# Patient Record
Sex: Female | Born: 1975 | Race: White | Hispanic: No | Marital: Married | Smoking: Former smoker
Health system: Southern US, Community
[De-identification: ages and names within clinical notes are randomized; demographics above are authoritative.]

## PROBLEM LIST (undated history)

## (undated) DIAGNOSIS — B009 Herpesviral infection, unspecified: Secondary | ICD-10-CM

## (undated) DIAGNOSIS — Z464 Encounter for fitting and adjustment of orthodontic device: Secondary | ICD-10-CM

## (undated) DIAGNOSIS — J358 Other chronic diseases of tonsils and adenoids: Secondary | ICD-10-CM

## (undated) DIAGNOSIS — J3089 Other allergic rhinitis: Secondary | ICD-10-CM

## (undated) DIAGNOSIS — IMO0001 Reserved for inherently not codable concepts without codable children: Secondary | ICD-10-CM

## (undated) HISTORY — PX: APPENDECTOMY: SHX54

## (undated) HISTORY — PX: CHOLECYSTECTOMY: SHX55

## (undated) HISTORY — PX: SALPINGECTOMY: SHX328

---

## 1998-07-26 ENCOUNTER — Other Ambulatory Visit: Admission: RE | Admit: 1998-07-26 | Discharge: 1998-07-26 | Payer: Self-pay | Admitting: Obstetrics and Gynecology

## 2007-04-24 ENCOUNTER — Ambulatory Visit: Payer: Self-pay | Admitting: Internal Medicine

## 2007-04-30 ENCOUNTER — Ambulatory Visit: Payer: Self-pay | Admitting: Internal Medicine

## 2007-08-13 ENCOUNTER — Ambulatory Visit: Payer: Self-pay | Admitting: Internal Medicine

## 2010-06-11 ENCOUNTER — Ambulatory Visit: Payer: Self-pay | Admitting: Internal Medicine

## 2011-05-27 ENCOUNTER — Ambulatory Visit: Payer: Self-pay | Admitting: Internal Medicine

## 2012-12-02 ENCOUNTER — Ambulatory Visit: Payer: Self-pay | Admitting: Family Medicine

## 2012-12-02 LAB — RAPID STREP-A WITH REFLX: Micro Text Report: NEGATIVE

## 2012-12-06 LAB — BETA STREP CULTURE(ARMC)

## 2013-07-02 ENCOUNTER — Ambulatory Visit: Payer: Self-pay | Admitting: Neurology

## 2013-07-20 ENCOUNTER — Ambulatory Visit: Payer: Self-pay | Admitting: Emergency Medicine

## 2013-07-20 LAB — RAPID INFLUENZA A&B ANTIGENS

## 2014-01-28 ENCOUNTER — Ambulatory Visit: Payer: Self-pay | Admitting: Obstetrics and Gynecology

## 2014-01-28 LAB — BASIC METABOLIC PANEL
ANION GAP: 6 — AB (ref 7–16)
BUN: 14 mg/dL (ref 7–18)
Calcium, Total: 8.8 mg/dL (ref 8.5–10.1)
Chloride: 104 mmol/L (ref 98–107)
Co2: 27 mmol/L (ref 21–32)
Creatinine: 0.76 mg/dL (ref 0.60–1.30)
EGFR (African American): >60
EGFR (Non-African Amer.): >60
Glucose: 87 mg/dL (ref 65–99)
OSMOLALITY: 274 (ref 275–301)
Potassium: 4 mmol/L (ref 3.5–5.1)
Sodium: 137 mmol/L (ref 136–145)

## 2014-01-28 LAB — CBC
HCT: 38.7 % (ref 35.0–47.0)
HGB: 12.9 g/dL (ref 12.0–16.0)
MCH: 32.8 pg (ref 26.0–34.0)
MCHC: 33.2 g/dL (ref 32.0–36.0)
MCV: 99 fL (ref 80–100)
Platelet: 279 10*3/uL (ref 150–440)
RBC: 3.92 10*6/uL (ref 3.80–5.20)
RDW: 12.6 % (ref 11.5–14.5)
WBC: 10.3 10*3/uL (ref 3.6–11.0)

## 2014-02-06 ENCOUNTER — Ambulatory Visit: Payer: Self-pay | Admitting: Obstetrics and Gynecology

## 2014-02-10 LAB — PATHOLOGY REPORT

## 2014-10-03 ENCOUNTER — Ambulatory Visit
Admit: 2014-10-03 | Disposition: A | Discharge status: Home / Self Care | Payer: Self-pay | Attending: Family Medicine | Admitting: Family Medicine

## 2014-10-03 LAB — RAPID STREP-A WITH REFLX: MICRO TEXT REPORT: NEGATIVE

## 2014-10-06 LAB — BETA STREP CULTURE(ARMC)

## 2014-10-17 NOTE — Op Note (Signed)
PATIENT NAME:  Krystal Lawrence, [name] MR#:  [id number] DATE OF BIRTH:  12/01/1975  DATE OF PROCEDURE:  02/06/2014  PREOPERATIVE DIAGNOSIS: Multiparous female desiring permanent sterilization and prophylax for ovarian cancer.   POSTOPERATIVE DIAGNOSIS: Multiparous female desiring permanent sterilization and prophylax for ovarian cancer.   PROCEDURE: Bilateral laparoscopic salpingectomy.   ESTIMATED BLOOD LOSS: 25 mL.   FINDINGS: Fibroid uterus with normal tubes bilaterally. Normal ovaries bilaterally. Tubes removed post surgery.   DESCRIPTION OF PROCEDURE: The patient was taken to the operating room and placed in supine position. After adequate general endotracheal anesthesia was instilled, the patient was prepped and draped in the usual sterile fashion. A side-opening speculum was placed in the patient's vagina and the anterior lip of the cervix was grasped with a single-tooth tenaculum. Hulka tenaculum was placed and the side-opening speculum was removed as well as the single-tooth tenaculum. Attention was then turned to the umbilicus which was injected with Marcaine. Timeout was performed. Incision was made at the umbilicus. Veress needle was placed. Hanging drop test, fluid instillation test, and fluid aspiration test showed proper placement of Veress needle. CO2 was placed on low flow. When tympany was heard around the liver, CO2 was placed on high flow. Veress needle was removed. Trocar was placed. The patient was placed in Trendelenburg. The aforementioned findings were seen. The patient's previous incisions were used to make the two lower ports, one in the right lower quadrant, one in the midline. Harmonic scalpel and grasper was then used to cut across the broad ligament to the tube. The tube was then detached and the attachment to the uterus was cauterized with the Kleppinger. Tube was removed from the abdomen. In a similar fashion, the right tube was grasped, broad ligament was cut. Tube was excised  and removed from the belly. Belly was doused in Marcaine and trocars were then removed; 4-0 Monocryl was used to approximate the skin edges. Dermabond was placed, 2 x 2's and Tegaderm. Hulka tenaculum was removed from the patient's uterus. The patient was laid supine and taken to recovery after having tolerated the procedure well.     ____________________________ Elliot Gurneyarrie C. Oran Dillenburg, MD cck:JT D: 02/10/2014 23:55:21 ET T: 02/11/2014 00:42:43 ET JOB#: [id number]  cc: Elliot Gurneyarrie C. Brayden Betters, MD, <Dictator> Elliot GurneyARRIE C Berlin Viereck MD ELECTRONICALLY SIGNED 02/14/2014 17:40

## 2015-09-16 ENCOUNTER — Ambulatory Visit
Admission: EM | Admit: 2015-09-16 | Discharge: 2015-09-16 | Disposition: A | Discharge status: Home / Self Care | Attending: Emergency Medicine | Admitting: Emergency Medicine

## 2015-09-16 ENCOUNTER — Encounter: Payer: Self-pay | Admitting: Emergency Medicine

## 2015-09-16 DIAGNOSIS — J0140 Acute pansinusitis, unspecified: Secondary | ICD-10-CM

## 2015-09-16 DIAGNOSIS — R059 Cough, unspecified: Secondary | ICD-10-CM

## 2015-09-16 DIAGNOSIS — R05 Cough: Secondary | ICD-10-CM

## 2015-09-16 MED ORDER — DOXYCYCLINE HYCLATE 100 MG PO CAPS: 100.0000 mg | ORAL_CAPSULE | Freq: Two times a day (BID) | ORAL | Status: DC

## 2015-09-16 MED ORDER — HYDROCOD POLST-CPM POLST ER 10-8 MG/5ML PO SUER: 5.0000 mL | Freq: Two times a day (BID) | ORAL | Status: DC | PRN

## 2015-09-16 MED ORDER — AEROCHAMBER PLUS MISC: Status: DC

## 2015-09-16 MED ORDER — BENZONATATE 100 MG PO CAPS: 200.0000 mg | ORAL_CAPSULE | Freq: Three times a day (TID) | ORAL | Status: DC

## 2015-09-16 MED ORDER — ALBUTEROL SULFATE HFA 108 (90 BASE) MCG/ACT IN AERS: 1.0000 | INHALATION_SPRAY | Freq: Four times a day (QID) | RESPIRATORY_TRACT | Status: DC | PRN

## 2015-09-16 MED ORDER — FLUCONAZOLE 150 MG PO TABS: 150.0000 mg | ORAL_TABLET | Freq: Once | ORAL | Status: DC

## 2015-09-16 NOTE — ED Notes (Signed)
Patient  c/o cough and chest congestion for over a week.   

## 2015-09-16 NOTE — Discharge Instructions (Signed)
2 puffs from your albuterol inhaler every 4-6 hours as needed for coughing, chest tightness, shortness of breath. Continue the ibuprofen. Start some Mucinex D, saline nasal irrigation with either a neti pot or Neil sinus rinse.

## 2015-09-16 NOTE — ED Provider Notes (Signed)
HPI  SUBJECTIVE:  Krystal Lawrence is a 40 y.o. female who presents with 11 days of fever Tmax 101.1, chills, nausea, nasal congestion, rhinorrhea, bilateral ear pain. She reports sore throat, body aches, headaches, postnasal drip. She reports cough productive of yellowish greenish sputum, constant chest tightness with no exertional positional component, wheezing, shortness of breath with coughing only. States that she is unable to sleep at night secondary to coughing. Denies other chest pain. No abdominal pain, urinary complaints. There are no aggravating or alleviating factors. She has been taking Delsym, Tessalon 200 mg, Sudafed, ibuprofen, and is on day #3 of amoxicillin 875 mg. She was seen by her primary care physician earlier in the course of the illness, sent home with Tessalon Perles and amoxicillin. She states that she was getting better, and then got worse. She has taken ibuprofen in the past 4-6 hours prior to arrival. Past medical history negative for asthma, eczema, COPD, diabetes, hypertension. She is not a smoker. LMP: 3/3. PMD: [facility].    History reviewed. No pertinent past medical history.  Past Surgical History  Procedure Laterality Date  . Appendectomy    . Cholecystectomy      History reviewed. No pertinent family history.  Social History  Substance Use Topics  . Smoking status: Never Smoker   . Smokeless tobacco: None  . Alcohol Use: Yes    No current facility-administered medications for this encounter.  Current outpatient prescriptions:  .  albuterol (PROVENTIL HFA;VENTOLIN HFA) 108 (90 Base) MCG/ACT inhaler, Inhale 1-2 puffs into the lungs every 6 (six) hours as needed for wheezing or shortness of breath., Disp: 1 Inhaler, Rfl: 0 .  benzonatate (TESSALON) 100 MG capsule, Take 2 capsules (200 mg total) by mouth every 8 (eight) hours., Disp: 21 capsule, Rfl: 0 .  chlorpheniramine-HYDROcodone (TUSSIONEX PENNKINETIC ER) 10-8 MG/5ML SUER, Take 5 mLs by  mouth every 12 (twelve) hours as needed for cough., Disp: 120 mL, Rfl: 0 .  doxycycline (VIBRAMYCIN) 100 MG capsule, Take 1 capsule (100 mg total) by mouth 2 (two) times daily. X 7 days, Disp: 14 capsule, Rfl: 0 .  fluconazole (DIFLUCAN) 150 MG tablet, Take 1 tablet (150 mg total) by mouth once. 1 tab po x 1. May repeat in 72 hours if no improvement, Disp: 2 tablet, Rfl: 1 .  Spacer/Aero-Holding Chambers (AEROCHAMBER PLUS) inhaler, Use as instructed, Disp: 1 each, Rfl: 2  No Known Allergies   ROS  As noted in HPI.   Physical Exam  BP 148/83 mmHg  Pulse 100  Temp(Src) 98.7 F (37.1 C) (Tympanic)  Resp 17  Ht 5\' 5"  (1.651 m)  Wt 157 lb (71.215 kg)  BMI 26.13 kg/m2  SpO2 100%  LMP 08/27/2015 (Exact Date)  Constitutional: Well developed, well nourished, no acute distress Eyes: PERRL, EOMI, conjunctiva normal bilaterally HENT: Normocephalic, atraumatic,mucus membranes moist. TMs normal bilaterally. Positive maxillary, frontal sinus tenderness. Erythematous, swollen turbinates. Positive nasal congestion. Throat slightly erythematous, tonsils normal, uvula midline. Neck: No cervical lymphadenopathy, meningismus. Respiratory: Clear to auscultation bilaterally, no rales, no wheezing, no rhonchi Cardiovascular: Regular tachycardia, no murmurs, no gallops, no rubs GI: Soft, nondistended, nontender, no rebound, no guarding Back: no CVAT skin: No rash, skin intact Musculoskeletal: No edema, no tenderness, no deformities Neurologic: Alert & oriented x 3, CN II-XII grossly intact, no motor deficits, sensation grossly intact Psychiatric: Speech and behavior appropriate   ED Course   Medications - No data to display  No orders of the defined types were placed  in this encounter.   No results found for this or any previous visit (from the past 24 hour(s)). No results found.  ED Clinical Impression  Acute pansinusitis, recurrence not specified  Cough  ED Assessment/Plan  Patient  not getting better despite the amoxicillin. She does have some sinus tenderness, so we'll switch her to doxycycline which cover both sinusitis and pneumonia. Lungs clear today. Plan to start Mucinex D, refill her Tessalon pearls 200 mg, cough syrup, saline nasal irrigation, nasal steroid, albuterol with spacer,  Diflucan as patient states that she gets yeast infections frequently with antibiotics. Up with primary care physician as needed. Go to the ER if she gets worse.  Discussed MDM, plan and followup with patient. Discussed sn/sx that should prompt return to the  ED. Patient agrees with plan.   *This clinic note was created using Dragon dictation software. Therefore, there may be occasional mistakes despite careful proofreading.  ?   Domenick Gong, MD 09/16/15 1215

## 2016-06-16 ENCOUNTER — Ambulatory Visit
Admission: EM | Admit: 2016-06-16 | Discharge: 2016-06-16 | Disposition: A | Discharge status: Home / Self Care | Attending: Family Medicine | Admitting: Family Medicine

## 2016-06-16 ENCOUNTER — Encounter: Payer: Self-pay | Admitting: Emergency Medicine

## 2016-06-16 DIAGNOSIS — J40 Bronchitis, not specified as acute or chronic: Secondary | ICD-10-CM

## 2016-06-16 MED ORDER — FLUCONAZOLE 150 MG PO TABS: 150.0000 mg | ORAL_TABLET | Freq: Once | ORAL | 0 refills | Status: AC

## 2016-06-16 MED ORDER — ALBUTEROL SULFATE HFA 108 (90 BASE) MCG/ACT IN AERS: 2.0000 | INHALATION_SPRAY | Freq: Four times a day (QID) | RESPIRATORY_TRACT | 0 refills | Status: DC | PRN

## 2016-06-16 MED ORDER — AZITHROMYCIN 250 MG PO TABS: ORAL_TABLET | ORAL | 0 refills | Status: DC

## 2016-06-16 NOTE — ED Triage Notes (Signed)
Patient c/o cough and chest congestion for 6 weeks.  Patient denies recent fevers.

## 2016-06-16 NOTE — ED Provider Notes (Signed)
CSN: [id number]     Arrival date & time 06/16/16  1930 History   First MD Initiated Contact with Patient 06/16/16 1956     Chief Complaint  Patient presents with  . Cough   (Consider location/radiation/quality/duration/timing/severity/associated sxs/prior Treatment) 40 year old female presents with nasal congestion, cough, chest tightness and ear fullness for the past 6 weeks. Denies any fever or GI symptoms. Cough is worse at night and coughing up yellow mucus in the morning. Has become worse in the past few days. Has tried Mucinex DM and Sudafed with minimal relief. No recent travel. Daughter is ill and being seen now with probable GI virus.    The history is provided by the patient.    History reviewed. No pertinent past medical history. Past Surgical History:  Procedure Laterality Date  . APPENDECTOMY    . CHOLECYSTECTOMY     History reviewed. No pertinent family history. Social History  Substance Use Topics  . Smoking status: Never Smoker  . Smokeless tobacco: Never Used  . Alcohol use Yes   OB History    No data available     Review of Systems  Constitutional: Positive for fatigue. Negative for appetite change, chills and fever.  HENT: Positive for congestion. Negative for ear pain (ear fullness), rhinorrhea, sinus pain, sinus pressure and sore throat.   Eyes: Negative for discharge.  Respiratory: Positive for cough and chest tightness. Negative for shortness of breath and wheezing.   Cardiovascular: Negative for chest pain.  Gastrointestinal: Negative for abdominal pain, diarrhea, nausea and vomiting.  Musculoskeletal: Negative for back pain, neck pain and neck stiffness.  Skin: Negative for rash.  Neurological: Negative for dizziness, weakness, light-headedness and headaches.  Hematological: Negative for adenopathy.    Allergies  Patient has no known allergies.  Home Medications   Prior to Admission medications   Medication Sig Start Date End Date Taking?  Authorizing Provider  valACYclovir (VALTREX) 1000 MG tablet Take 1,000 mg by mouth daily.   Yes Historical Provider, MD  albuterol (PROVENTIL HFA;VENTOLIN HFA) 108 (90 Base) MCG/ACT inhaler Inhale 2 puffs into the lungs every 6 (six) hours as needed for wheezing or shortness of breath. 06/16/16   Sudie GrumblingAnn Berry Anuoluwapo Mefferd, NP  azithromycin (ZITHROMAX) 250 MG tablet Take first 2 tablets by mouth together the first day, then 1 tablet every day until finished. 06/16/16   Sudie GrumblingAnn Berry Bladyn Tipps, NP  fluconazole (DIFLUCAN) 150 MG tablet Take 1 tablet (150 mg total) by mouth once. May repeat 1 tablet in 4 days if needed 06/16/16 06/16/16  Sudie GrumblingAnn Berry Zamariah Seaborn, NP   Meds Ordered and Administered this Visit  Medications - No data to display  BP 136/86 (BP Location: Right Arm)   Pulse 97   Temp 97.7 F (36.5 C) (Tympanic)   Resp 16   Ht 5' 5.5" (1.664 m)   Wt 156 lb (70.8 kg)   LMP 06/10/2016 (Approximate)   SpO2 100%   BMI 25.56 kg/m  No data found.   Physical Exam  Constitutional: She is oriented to person, place, and time. She appears well-developed and well-nourished. No distress.  HENT:  Head: Normocephalic and atraumatic.  Right Ear: Hearing, external ear and ear canal normal. Tympanic membrane is bulging. No middle ear effusion.  Left Ear: Hearing, external ear and ear canal normal. Tympanic membrane is bulging. A middle ear effusion is present.  Nose: Mucosal edema present. Right sinus exhibits no maxillary sinus tenderness and no frontal sinus tenderness. Left sinus exhibits no maxillary sinus  tenderness and no frontal sinus tenderness.  Mouth/Throat: Uvula is midline, oropharynx is clear and moist and mucous membranes are normal.  Neck: Normal range of motion. Neck supple.  Cardiovascular: Normal rate, regular rhythm and normal heart sounds.   Pulmonary/Chest: Effort normal. No respiratory distress. She has decreased breath sounds in the right upper field, the right lower field, the left upper field and  the left lower field. She has no wheezes. She has rhonchi in the right upper field and the left upper field.  Lymphadenopathy:    She has no cervical adenopathy.  Neurological: She is alert and oriented to person, place, and time.  Skin: Skin is warm and dry.  Psychiatric: She has a normal mood and affect. Her behavior is normal. Judgment and thought content normal.    Urgent Care Course   Clinical Course     Procedures (including critical care time)  Labs Review Labs Reviewed - No data to display  Imaging Review No results found.   Visual Acuity Review  Right Eye Distance:   Left Eye Distance:   Bilateral Distance:    Right Eye Near:   Left Eye Near:    Bilateral Near:         MDM   1. Bronchitis    Discussed with the patient that she probably has a viral illness. However, since symptoms have been occurring for over 1 month and have become worse recently, will cover with antibiotics in case there is a bacterial component to her illness. Recommend Zithromax as directed. May take Diflucan 150mg  one tablet at start of antibiotic, repeat 1 tablet in 4 days to prevent vaginal yeast infection from antibiotic use. Recommend try Albuterol inhaler 2 puffs every 6 hours as needed for cough. Increase fluid intake to help loosen mucus. Follow-up with her primary care provider in 5 to 7 days if not improving.      Sudie GrumblingAnn Berry Dequon Schnebly, NP 06/16/16 2122

## 2016-06-16 NOTE — Discharge Instructions (Signed)
Recommend start Zithromax as directed. May use Albuterol 2 puffs every 6 hours as needed for chest tightness and cough. Take Diflucan 150mg  one tablet now, may repeat 1 tablet in 4 days to prevent antibiotic-induced yeast infection. Follow-up with your primary care provider in 5 to 7 days if not improving.

## 2016-10-29 ENCOUNTER — Ambulatory Visit
Admission: EM | Admit: 2016-10-29 | Discharge: 2016-10-29 | Disposition: A | Discharge status: Home / Self Care | Attending: Emergency Medicine | Admitting: Emergency Medicine

## 2016-10-29 ENCOUNTER — Ambulatory Visit (INDEPENDENT_AMBULATORY_CARE_PROVIDER_SITE_OTHER)

## 2016-10-29 DIAGNOSIS — M546 Pain in thoracic spine: Secondary | ICD-10-CM | POA: Diagnosis not present

## 2016-10-29 DIAGNOSIS — M62830 Muscle spasm of back: Secondary | ICD-10-CM

## 2016-10-29 HISTORY — DX: Herpesviral infection, unspecified: B00.9

## 2016-10-29 HISTORY — DX: Other allergic rhinitis: J30.89

## 2016-10-29 MED ORDER — TIZANIDINE HCL 4 MG PO TABS: 4.0000 mg | ORAL_TABLET | Freq: Three times a day (TID) | ORAL | 0 refills | Status: DC | PRN

## 2016-10-29 MED ORDER — ACETAMINOPHEN 500 MG PO TABS
1000.0000 mg | ORAL_TABLET | Freq: Once | ORAL | Status: AC
  Administered 2016-10-29: 1000 mg via ORAL

## 2016-10-29 MED ORDER — HYDROCODONE-ACETAMINOPHEN 5-325 MG PO TABS: 1.0000 | ORAL_TABLET | ORAL | 0 refills | Status: DC | PRN

## 2016-10-29 MED ORDER — IBUPROFEN 600 MG PO TABS: 600.0000 mg | ORAL_TABLET | Freq: Four times a day (QID) | ORAL | 0 refills | Status: DC | PRN

## 2016-10-29 MED ORDER — KETOROLAC TROMETHAMINE 60 MG/2ML IM SOLN
30.0000 mg | Freq: Once | INTRAMUSCULAR | Status: AC
  Administered 2016-10-29: 30 mg via INTRAMUSCULAR

## 2016-10-29 NOTE — ED Triage Notes (Signed)
Pt with right mid thoracic back pain starting last p.m. And has been in spasms. Current pain level 10/10

## 2016-10-29 NOTE — Discharge Instructions (Signed)
Take the medication as written. Take 1 gram of tylenol with the motrin up to 4 times a day as needed for pain and fever. This  is an effective combination for pain. Take the hydrocodone/norco only for severe pain. May take either the ibuprofen with the Tylenol or the ibuprofen with the Norco. Do not take the tylenol and hydrocodone/norco as they both have tylenol in them and too much can hurt your liver. Do not exceed 4 grams of tylenol a day from all sources. He just tablet of Norco has 325 mg of Tylenol in it. Return to the ER if you get worse, have a  fever >100.4, for the signs and symptoms we discussed, or for any concerns.   Go to www.goodrx.com to look up your medications. This will give you a list of where you can find your prescriptions at the most affordable prices.

## 2016-10-29 NOTE — ED Provider Notes (Signed)
HPI  SUBJECTIVE:  Krystal Lawrence is a right handed 41 y.o. female who presents with the acute onset of constant, sharp, throbbing right lower thoracic back pain starting last night. States that she was sitting in a chair when it started. She states that she painted a door 3 days ago, but was fine up until last night. She denies chest pain, shoulder pain, coughing, wheezing, short of breath. No fevers, rash, trauma or change in physical activity. She denies hemoptysis, lower extremity edema, surgery in the past four weeks, prolonged immobilization, calf pain or swelling, OCP use, history of DVT or PE. She has had similar symptoms like this before when she had a severe muscle spasm. She tried ibuprofen 800 mg and heat without improvement in her symptoms. Symptoms are worse with large positional changes, arm movement and torso rotation.  LMP: 2 weeks ago. Denies possibility of being pregnant. PMD: [facility].  Past Medical History:  Diagnosis Date  . Environmental and seasonal allergies   . Herpes     Past Surgical History:  Procedure Laterality Date  . APPENDECTOMY    . CHOLECYSTECTOMY    . SALPINGECTOMY      History reviewed. No pertinent family history.  Social History  Substance Use Topics  . Smoking status: Former Smoker    Quit date: 10/30/2007  . Smokeless tobacco: Never Used  . Alcohol use Yes    No current facility-administered medications for this encounter.   Current Outpatient Prescriptions:  .  loratadine (CLARITIN) 10 MG tablet, Take 10 mg by mouth daily., Disp: , Rfl:  .  HYDROcodone-acetaminophen (NORCO/VICODIN) 5-325 MG tablet, Take 1-2 tablets by mouth every 4 (four) hours as needed for moderate pain., Disp: 20 tablet, Rfl: 0 .  ibuprofen (ADVIL,MOTRIN) 600 MG tablet, Take 1 tablet (600 mg total) by mouth every 6 (six) hours as needed., Disp: 30 tablet, Rfl: 0 .  tiZANidine (ZANAFLEX) 4 MG tablet, Take 1 tablet (4 mg total) by mouth every 8 (eight) hours as  needed for muscle spasms., Disp: 30 tablet, Rfl: 0 .  valACYclovir (VALTREX) 1000 MG tablet, Take 1,000 mg by mouth daily., Disp: , Rfl:   No Known Allergies   ROS  As noted in HPI.   Physical Exam  BP (!) 150/99 (BP Location: Right Arm)   Pulse 95   Temp 98.3 F (36.8 C) (Oral)   Resp 20   Ht 5\' 5"  (1.651 m)   Wt 169 lb (76.7 kg)   LMP 10/22/2016   SpO2 100%   BMI 28.12 kg/m   Constitutional: Well developed, well nourished, Appears moderately uncomfortable Eyes: PERRL, EOMI, conjunctiva normal bilaterally HENT: Normocephalic, atraumatic,mucus membranes moist Respiratory: Clear to auscultation bilaterally, no rales, no wheezing, no rhonchi Cardiovascular: Normal rate and rhythm, no murmurs, no gallops, no rubs GI: Nondistended Back: no CVAT. Negative C-spine, T-spine, L-spine tenderness. No tenderness over the trapezius, rhomboid. Positive tenderness along the lower right thoracic ribs with some questionable muscle spasm. No bruising, rash. Pain with rowing movement and movement of her shoulder. Patient refused to perform torso rotation skin: No rash, skin intact Musculoskeletal: Calves symmetric, nontender, no tenderness, no deformities Neurologic: Alert & oriented x 3, CN II-XII grossly intact, no motor deficits, sensation grossly intact Psychiatric: Speech and behavior appropriate   ED Course   Medications  ketorolac (TORADOL) injection 30 mg (30 mg Intramuscular Given 10/29/16 1241)  acetaminophen (TYLENOL) tablet 1,000 mg (1,000 mg Oral Given 10/29/16 1239)    Orders Placed This  Encounter  Procedures  . DG Ribs Unilateral W/Chest Right    Standing Status:   Standing    Number of Occurrences:   1    Order Specific Question:   Reason for Exam (SYMPTOM  OR DIAGNOSIS REQUIRED)    Answer:   tenderness over posterior lower ribs r/o fx, ptx   No results found for this or any previous visit (from the past 24 hour(s)). Dg Ribs Unilateral W/chest Right  Result Date:  10/29/2016 CLINICAL DATA:  Pt with right posterior rib pain since last night with no known hx of trauma or injury. Pt hx of cholecystectomy, appendectomy. A metallic BB was placed on the right posterior rib area of pain by the performing radiology technologist. EXAM: RIGHT RIBS AND CHEST - 3+ VIEW COMPARISON:  04/30/2007 FINDINGS: Frontal view the chest and three views of right-sided ribs. Frontal view of the chest demonstrates mild right hemidiaphragm elevation. Midline trachea. Normal heart size and mediastinal contours. No pleural effusion or pneumothorax. Minimal biapical pleural thickening. Clear lungs. Right rib films demonstrate no displaced fracture. Minimal S-shaped thoracic spine curvature. Radiographic marker projecting over the tenth posterolateral right rib. Cholecystectomy clips. IMPRESSION: No acute findings. Electronically Signed   By: Jeronimo GreavesKyle  Talbot M.D.   On: 10/29/2016 13:16    ED Clinical Impression  Spasm of thoracic back muscle  ED Assessment/Plan  [facility]. No opiate prescriptions in the past 6 months.  Pt PERC neg. Giving 30 mg of Toradol IM with thousand milligrams of Tylenol. We'll check rib series because of the exquisite rib tenderness and also to rule out a pneumothorax.  Imaging independently reviewed. No pneumothorax, rib fracture. See radiology report for details.  On reevaluation, patient states she feels better after the medication.   If this is negative, presentation most consistent with acute muscle spasm of the thoracic area. No evidence of shingles. We'll send home with ibuprofen 600 mg with 1 g of Tylenol 3-4 times daily together as needed for pain, Zanaflex, Norco and a work note for tomorrow.  patient is a Runner, broadcasting/film/videoteacher. Disussed imaging, MDM, plan and followup with patient . Discussed sn/sx that should prompt return to the ED. Patient agrees with plan.   Meds ordered this encounter  Medications  . loratadine (CLARITIN) 10 MG  tablet    Sig: Take 10 mg by mouth daily.  Marland Kitchen. ketorolac (TORADOL) injection 30 mg  . acetaminophen (TYLENOL) tablet 1,000 mg  . ibuprofen (ADVIL,MOTRIN) 600 MG tablet    Sig: Take 1 tablet (600 mg total) by mouth every 6 (six) hours as needed.    Dispense:  30 tablet    Refill:  0  . tiZANidine (ZANAFLEX) 4 MG tablet    Sig: Take 1 tablet (4 mg total) by mouth every 8 (eight) hours as needed for muscle spasms.    Dispense:  30 tablet    Refill:  0  . HYDROcodone-acetaminophen (NORCO/VICODIN) 5-325 MG tablet    Sig: Take 1-2 tablets by mouth every 4 (four) hours as needed for moderate pain.    Dispense:  20 tablet    Refill:  0    *This clinic note was created using Scientist, clinical (histocompatibility and immunogenetics)Dragon dictation software. Therefore, there may be occasional mistakes despite careful proofreading.  ?   Domenick GongMortenson, Jaxyn Rout, MD 10/29/16 [phone]

## 2017-07-30 ENCOUNTER — Other Ambulatory Visit: Payer: Self-pay | Admitting: Family Medicine

## 2017-07-30 DIAGNOSIS — Z1239 Encounter for other screening for malignant neoplasm of breast: Secondary | ICD-10-CM

## 2017-11-30 ENCOUNTER — Other Ambulatory Visit: Payer: Self-pay | Admitting: Advanced Practice Midwife

## 2017-11-30 DIAGNOSIS — B009 Herpesviral infection, unspecified: Secondary | ICD-10-CM

## 2017-11-30 MED ORDER — VALACYCLOVIR HCL 1 G PO TABS: 1000.0000 mg | ORAL_TABLET | Freq: Every day | ORAL | 0 refills | Status: AC

## 2017-11-30 NOTE — Progress Notes (Signed)
Rx 90 day supply Valtrex sent to [facility]

## 2018-02-17 ENCOUNTER — Other Ambulatory Visit: Payer: Self-pay | Admitting: Advanced Practice Midwife

## 2018-02-17 DIAGNOSIS — B009 Herpesviral infection, unspecified: Secondary | ICD-10-CM

## 2018-02-18 NOTE — Telephone Encounter (Signed)
Please advise 

## 2018-02-20 NOTE — Telephone Encounter (Signed)
Not sure I understand the refill request. I sent enough for a whole year.

## 2018-03-17 ENCOUNTER — Ambulatory Visit
Admission: EM | Admit: 2018-03-17 | Discharge: 2018-03-17 | Disposition: A | Discharge status: Home / Self Care | Attending: Family Medicine | Admitting: Family Medicine

## 2018-03-17 DIAGNOSIS — J069 Acute upper respiratory infection, unspecified: Secondary | ICD-10-CM

## 2018-03-17 DIAGNOSIS — B9789 Other viral agents as the cause of diseases classified elsewhere: Secondary | ICD-10-CM

## 2018-03-17 MED ORDER — HYDROCOD POLST-CPM POLST ER 10-8 MG/5ML PO SUER: 5.0000 mL | Freq: Two times a day (BID) | ORAL | 0 refills | Status: DC | PRN

## 2018-03-17 MED ORDER — PREDNISONE 20 MG PO TABS: 20.0000 mg | ORAL_TABLET | Freq: Every day | ORAL | 0 refills | Status: DC

## 2018-03-17 NOTE — ED Triage Notes (Signed)
Pt here for sinus infection and has been taking otc medications without relief. Headache, eye pressure, productive cough and watery eyes since Thursday.

## 2018-03-17 NOTE — ED Provider Notes (Signed)
[facility]    CSN: [id number] Arrival date & time: 03/17/18  0808     History   Chief Complaint Chief Complaint  Patient presents with  . Sinusitis    HPI Krystal Lawrence is a 42 y.o. female.   The history is provided by the patient.  Sinusitis  Associated symptoms: congestion, cough and rhinorrhea   Associated symptoms: no wheezing   URI  Presenting symptoms: congestion, cough and rhinorrhea   Severity:  Moderate Onset quality:  Sudden Duration:  4 days Timing:  Constant Progression:  Unchanged Chronicity:  New Relieved by:  Nothing Ineffective treatments:  OTC medications Associated symptoms: sinus pain   Associated symptoms: no wheezing   Risk factors: sick contacts   Risk factors: not elderly, no chronic cardiac disease, no chronic kidney disease, no chronic respiratory disease, no diabetes mellitus, no immunosuppression, no recent illness and no recent travel     Past Medical History:  Diagnosis Date  . Environmental and seasonal allergies   . Herpes     There are no active problems to display for this patient.   Past Surgical History:  Procedure Laterality Date  . APPENDECTOMY    . CHOLECYSTECTOMY    . SALPINGECTOMY      OB History   None      Home Medications    Prior to Admission medications   Medication Sig Start Date End Date Taking? Authorizing Provider  loratadine (CLARITIN) 10 MG tablet Take 10 mg by mouth daily.   Yes [provider]  valACYclovir (VALTREX) 1000 MG tablet Take 1 tablet (1,000 mg total) by mouth daily. 11/30/17  Yes Tresea MallGledhill, Jane, CNM  chlorpheniramine-HYDROcodone (TUSSIONEX PENNKINETIC ER) 10-8 MG/5ML SUER Take 5 mLs by mouth every 12 (twelve) hours as needed. 03/17/18   Payton Mccallumonty, Angelis Gates, MD  HYDROcodone-acetaminophen (NORCO/VICODIN) 5-325 MG tablet Take 1-2 tablets by mouth every 4 (four) hours as needed for moderate pain. 10/29/16   Domenick GongMortenson, Ashley, MD  ibuprofen (ADVIL,MOTRIN) 600 MG tablet Take  1 tablet (600 mg total) by mouth every 6 (six) hours as needed. 10/29/16   Domenick GongMortenson, Ashley, MD  predniSONE (DELTASONE) 20 MG tablet Take 1 tablet (20 mg total) by mouth daily. 03/17/18   Payton Mccallumonty, Loral Campi, MD  tiZANidine (ZANAFLEX) 4 MG tablet Take 1 tablet (4 mg total) by mouth every 8 (eight) hours as needed for muscle spasms. 10/29/16   Domenick GongMortenson, Ashley, MD    Family History No family history on file.  Social History Social History   Tobacco Use  . Smoking status: Former Smoker    Last attempt to quit: 10/30/2007    Years since quitting: 10.3  . Smokeless tobacco: Never Used  Substance Use Topics  . Alcohol use: Yes  . Drug use: No     Allergies   Patient has no known allergies.   Review of Systems Review of Systems  HENT: Positive for congestion, rhinorrhea and sinus pain.   Respiratory: Positive for cough. Negative for wheezing.      Physical Exam Triage Vital Signs ED Triage Vitals  Enc Vitals Group     BP 03/17/18 0816 (!) 142/89     Pulse Rate 03/17/18 0816 (!) 105     Resp 03/17/18 0816 18     Temp 03/17/18 0816 98 F (36.7 C)     Temp Source 03/17/18 0816 Oral     SpO2 03/17/18 0816 98 %     Weight 03/17/18 0816 179 lb (81.2 kg)  Height --      Head Circumference --      Peak Flow --      Pain Score 03/17/18 0815 2     Pain Loc --      Pain Edu? --      Excl. in GC? --    No data found.  Updated Vital Signs BP (!) 142/89 (BP Location: Left Arm)   Pulse (!) 105   Temp 98 F (36.7 C) (Oral)   Resp 18   Wt 81.2 kg   LMP 03/01/2018 (Exact Date)   SpO2 98%   BMI 29.79 kg/m   Visual Acuity Right Eye Distance:   Left Eye Distance:   Bilateral Distance:    Right Eye Near:   Left Eye Near:    Bilateral Near:     Physical Exam  Constitutional: She appears well-developed and well-nourished. No distress.  HENT:  Head: Normocephalic and atraumatic.  Right Ear: Tympanic membrane, external ear and ear canal normal.  Left Ear: Tympanic membrane,  external ear and ear canal normal.  Nose: Mucosal edema and rhinorrhea present. No nose lacerations, sinus tenderness, nasal deformity, septal deviation or nasal septal hematoma. No epistaxis.  No foreign bodies. Right sinus exhibits no maxillary sinus tenderness and no frontal sinus tenderness. Left sinus exhibits no maxillary sinus tenderness and no frontal sinus tenderness.  Mouth/Throat: Uvula is midline, oropharynx is clear and moist and mucous membranes are normal. No oropharyngeal exudate, posterior oropharyngeal edema, posterior oropharyngeal erythema or tonsillar abscesses. No tonsillar exudate.  Eyes: Conjunctivae are normal. Right eye exhibits no discharge. Left eye exhibits no discharge. No scleral icterus.  Neck: Normal range of motion. Neck supple. No thyromegaly present.  Cardiovascular: Normal rate, regular rhythm and normal heart sounds.  Pulmonary/Chest: Effort normal and breath sounds normal. No stridor. No respiratory distress. She has no wheezes. She has no rales.  Lymphadenopathy:    She has no cervical adenopathy.  Skin: She is not diaphoretic.  Nursing note and vitals reviewed.    UC Treatments / Results  Labs (all labs ordered are listed, but only abnormal results are displayed) Labs Reviewed - No data to display  EKG None  Radiology No results found.  Procedures Procedures (including critical care time)  Medications Ordered in UC Medications - No data to display  Initial Impression / Assessment and Plan / UC Course  I have reviewed the triage vital signs and the nursing notes.  Pertinent labs & imaging results that were available during my care of the patient were reviewed by me and considered in my medical decision making (see chart for details).      Final Clinical Impressions(s) / UC Diagnoses   Final diagnoses:  Viral URI with cough   Discharge Instructions   None    ED Prescriptions    Medication Sig Dispense Auth. Provider   predniSONE  (DELTASONE) 20 MG tablet Take 1 tablet (20 mg total) by mouth daily. 5 tablet Payton Mccallum, MD   chlorpheniramine-HYDROcodone (TUSSIONEX PENNKINETIC ER) 10-8 MG/5ML SUER Take 5 mLs by mouth every 12 (twelve) hours as needed. 60 mL Payton Mccallum, MD     1. diagnosis reviewed with patient 2. rx as per orders above; reviewed possible side effects, interactions, risks and benefits  3. Recommend supportive treatment with otc medications prn 4. Follow-up prn if symptoms worsen or don't improve   Controlled Substance Prescriptions [place] Controlled Substance Registry consulted? Not Applicable   Payton Mccallum, MD 03/17/18 [phone]

## 2018-04-04 ENCOUNTER — Other Ambulatory Visit: Payer: Self-pay | Admitting: Orthopedic Surgery

## 2018-04-04 DIAGNOSIS — G5621 Lesion of ulnar nerve, right upper limb: Secondary | ICD-10-CM

## 2018-04-04 DIAGNOSIS — S4401XD Injury of ulnar nerve at upper arm level, right arm, subsequent encounter: Secondary | ICD-10-CM

## 2018-04-23 ENCOUNTER — Ambulatory Visit
Admission: RE | Admit: 2018-04-23 | Discharge: 2018-04-23 | Disposition: A | Discharge status: Home / Self Care | Source: Ambulatory Visit | Attending: Orthopedic Surgery | Admitting: Orthopedic Surgery

## 2018-04-23 DIAGNOSIS — G5621 Lesion of ulnar nerve, right upper limb: Secondary | ICD-10-CM

## 2018-04-23 DIAGNOSIS — S4401XD Injury of ulnar nerve at upper arm level, right arm, subsequent encounter: Secondary | ICD-10-CM | POA: Diagnosis present

## 2018-11-25 ENCOUNTER — Ambulatory Visit: Payer: Self-pay | Admitting: Otolaryngology

## 2018-12-02 ENCOUNTER — Other Ambulatory Visit: Payer: Self-pay

## 2018-12-02 ENCOUNTER — Other Ambulatory Visit
Admission: RE | Admit: 2018-12-02 | Discharge: 2018-12-02 | Disposition: A | Discharge status: Home / Self Care | Source: Ambulatory Visit | Attending: Otolaryngology | Admitting: Otolaryngology

## 2018-12-02 ENCOUNTER — Encounter: Payer: Self-pay | Admitting: *Deleted

## 2018-12-02 DIAGNOSIS — Z1159 Encounter for screening for other viral diseases: Secondary | ICD-10-CM | POA: Diagnosis not present

## 2018-12-02 NOTE — Anesthesia Preprocedure Evaluation (Addendum)
Anesthesia Evaluation  Patient identified by MRN, date of birth, ID band Patient awake    Reviewed: Allergy & Precautions, NPO status , Patient's Chart, lab work & pertinent test results  History of Anesthesia Complications Negative for: history of anesthetic complications  Airway Mallampati: I   Neck ROM: Full    Dental no notable dental hx.    Pulmonary former smoker (quit 2009),    Pulmonary exam normal breath sounds clear to auscultation       Cardiovascular Exercise Tolerance: Good negative cardio ROS Normal cardiovascular exam Rhythm:Regular Rate:Normal     Neuro/Psych negative neurological ROS     GI/Hepatic negative GI ROS,   Endo/Other  negative endocrine ROS  Renal/GU negative Renal ROS     Musculoskeletal   Abdominal   Peds  Hematology negative hematology ROS (+)   Anesthesia Other Findings Tonsil stone  Reproductive/Obstetrics                            Anesthesia Physical Anesthesia Plan  ASA: I  Anesthesia Plan: General   Post-op Pain Management:    Induction: Intravenous  PONV Risk Score and Plan: 3 and Dexamethasone and Ondansetron  Airway Management Planned: Oral ETT  Additional Equipment:   Intra-op Plan:   Post-operative Plan: Extubation in OR  Informed Consent: I have reviewed the patients History and Physical, chart, labs and discussed the procedure including the risks, benefits and alternatives for the proposed anesthesia with the patient or authorized representative who has indicated his/her understanding and acceptance.       Plan Discussed with: CRNA  Anesthesia Plan Comments:        Anesthesia Quick Evaluation

## 2018-12-03 LAB — NOVEL CORONAVIRUS, NAA (HOSP ORDER, SEND-OUT TO REF LAB; TAT 18-24 HRS): SARS-CoV-2, NAA: NOT DETECTED

## 2018-12-04 NOTE — Discharge Instructions (Signed)
T & A INSTRUCTION SHEET - [facility] °[facility] ° °CREIGHTON VAUGHT, MD °PAUL H. JUENGEL, MD  °P. SCOTT BENNETT °CHAPMAN MCQUEEN, MD ° °[address] [place], [place] [address] TEL. [phone] °[address] [place] [address] [phone] ° °INFORMATION SHEET FOR A TONSILLECTOMY AND ADENDOIDECTOMY ° °About Your Tonsils and Adenoids ° The tonsils and adenoids are normal body tissues that are part of our immune system.  They normally help to protect us against diseases that may enter our mouth and nose.  However, sometimes the tonsils and/or adenoids become too large and obstruct our breathing, especially at night. °  ° If either of these things happen it helps to remove the tonsils and adenoids in order to become healthier. The operation to remove the tonsils and adenoids is called a tonsillectomy and adenoidectomy. ° °The Location of Your Tonsils and Adenoids ° The tonsils are located in the back of the throat on both side and sit in a cradle of muscles. The adenoids are located in the roof of the mouth, behind the nose, and closely associated with the opening of the Eustachian tube to the ear. ° °Surgery on Tonsils and Adenoids ° A tonsillectomy and adenoidectomy is a short operation which takes about thirty minutes.  This includes being put to sleep and being awakened.  Tonsillectomies and adenoidectomies are performed at [facility] and may require observation period in the recovery room prior to going home. ° °Following the Operation for a Tonsillectomy ° A cautery machine is used to control bleeding.  Bleeding from a tonsillectomy and adenoidectomy is minimal and postoperatively the risk of bleeding is approximately four percent, although this rarely life threatening. ° ° ° °After your tonsillectomy and adenoidectomy post-op care at home: ° °1. Our patients are able to go home the same day.  You may be given prescriptions for pain  medications and antibiotics, if indicated. °2. It is extremely important to remember that fluid intake is of utmost importance after a tonsillectomy.  The amount that you drink must be maintained in the postoperative period.  A good indication of whether a child is getting enough fluid is whether his/her urine output is constant.  As long as children are urinating or wetting their diaper every 6 - 8 hours this is usually enough fluid intake.   °3. Although rare, this is a risk of some bleeding in the first ten days after surgery.  This is usually occurs between day five and nine postoperatively.  This risk of bleeding is approximately four percent.  If you or your child should have any bleeding you should remain calm and notify our office or go directly to the Emergency Room at [facility] where they will contact us. Our doctors are available seven days a week for notification.  We recommend sitting up quietly in a chair, place an ice pack on the front of the neck and spitting out the blood gently until we are able to contact you.  Adults should gargle gently with ice water and this may help stop the bleeding.  If the bleeding does not stop after a short time, i.e. 10 to 15 minutes, or seems to be increasing again, please contact us or go to the hospital.   °4. It is common for the pain to be worse at 5 - 7 days postoperatively.  This occurs because the “scab” is peeling off and the mucous membrane (skin of   the throat) is growing back where the tonsils were.   °5. It is common for a low-grade fever, less than 102, during the first week after a tonsillectomy and adenoidectomy.  It is usually due to not drinking enough liquids, and we suggest your use liquid Tylenol or the pain medicine with Tylenol prescribed in order to keep your temperature below 102.  Please follow the directions on the back of the bottle. °6. Do not take aspirin or any products that contain aspirin such as Bufferin, Anacin,  Ecotrin, aspirin gum, Goodies, BC headache powders, etc., after a T&A because it can promote bleeding.  Please check with our office before administering any other medication that may been prescribed by other doctors during the two week post-operative period. °7. If you happen to look in the mirror or into your child’s mouth you will see white/gray patches on the back of the throat.  This is what a scab looks like in the mouth and is normal after having a T&A.  It will disappear once the tonsil area heals completely. However, it may cause a noticeable odor, and this too will disappear with time.     °8. You or your child may experience ear pain after having a T&A.  This is called referred pain and comes from the throat, but it is felt in the ears.  Ear pain is quite common and expected.  It will usually go away after ten days.  There is usually nothing wrong with the ears, and it is primarily due to the healing area stimulating the nerve to the ear that runs along the side of the throat.  Use either the prescribed pain medicine or Tylenol as needed.  °9. The throat tissues after a tonsillectomy are obviously sensitive.  Smoking around children who have had a tonsillectomy significantly increases the risk of bleeding.  DO NOT SMOKE!  ° °General Anesthesia, Adult, Care After °This sheet gives you information about how to care for yourself after your procedure. Your health care provider may also give you more specific instructions. If you have problems or questions, contact your health care provider. °What can I expect after the procedure? °After the procedure, the following side effects are common: °· Pain or discomfort at the IV site. °· Nausea. °· Vomiting. °· Sore throat. °· Trouble concentrating. °· Feeling cold or chills. °· Weak or tired. °· Sleepiness and fatigue. °· Soreness and body aches. These side effects can affect parts of the body that were not involved in surgery. °Follow these instructions at  home: ° °For at least 24 hours after the procedure: °· Have a responsible adult stay with you. It is important to have someone help care for you until you are awake and alert. °· Rest as needed. °· Do not: °? Participate in activities in which you could fall or become injured. °? Drive. °? Use heavy machinery. °? Drink alcohol. °? Take sleeping pills or medicines that cause drowsiness. °? Make important decisions or sign legal documents. °? Take care of children on your own. °Eating and drinking °· Follow any instructions from your health care provider about eating or drinking restrictions. °· When you feel hungry, start by eating small amounts of foods that are soft and easy to digest (bland), such as toast. Gradually return to your regular diet. °· Drink enough fluid to keep your urine pale yellow. °· If you vomit, rehydrate by drinking water, juice, or clear broth. °General instructions °· If you have sleep apnea,   surgery and certain medicines can increase your risk for breathing problems. Follow instructions from your health care provider about wearing your sleep device: °? Anytime you are sleeping, including during daytime naps. °? While taking prescription pain medicines, sleeping medicines, or medicines that make you drowsy. °· Return to your normal activities as told by your health care provider. Ask your health care provider what activities are safe for you. °· Take over-the-counter and prescription medicines only as told by your health care provider. °· If you smoke, do not smoke without supervision. °· Keep all follow-up visits as told by your health care provider. This is important. °Contact a health care provider if: °· You have nausea or vomiting that does not get better with medicine. °· You cannot eat or drink without vomiting. °· You have pain that does not get better with medicine. °· You are unable to pass urine. °· You develop a skin rash. °· You have a fever. °· You have redness around your IV  site that gets worse. °Get help right away if: °· You have difficulty breathing. °· You have chest pain. °· You have blood in your urine or stool, or you vomit blood. °Summary °· After the procedure, it is common to have a sore throat or nausea. It is also common to feel tired. °· Have a responsible adult stay with you for the first 24 hours after general anesthesia. It is important to have someone help care for you until you are awake and alert. °· When you feel hungry, start by eating small amounts of foods that are soft and easy to digest (bland), such as toast. Gradually return to your regular diet. °· Drink enough fluid to keep your urine pale yellow. °· Return to your normal activities as told by your health care provider. Ask your health care provider what activities are safe for you. °This information is not intended to replace advice given to you by your health care provider. Make sure you discuss any questions you have with your health care provider. °Document Released: 09/18/2000 Document Revised: 01/26/2017 Document Reviewed: 01/26/2017 °Elsevier Interactive Patient Education © 2019 [employer] ° °

## 2018-12-05 ENCOUNTER — Ambulatory Visit
Admission: RE | Admit: 2018-12-05 | Discharge: 2018-12-05 | Disposition: A | Discharge status: Home / Self Care | Attending: Otolaryngology | Admitting: Otolaryngology

## 2018-12-05 ENCOUNTER — Encounter
Admission: RE | Disposition: A | Discharge status: Home / Self Care | Payer: Self-pay | Source: Home / Self Care | Attending: Otolaryngology

## 2018-12-05 ENCOUNTER — Encounter: Payer: Self-pay | Admitting: Emergency Medicine

## 2018-12-05 ENCOUNTER — Emergency Department
Admission: EM | Admit: 2018-12-05 | Discharge: 2018-12-05 | Disposition: A | Discharge status: Home / Self Care | Source: Home / Self Care | Attending: Emergency Medicine | Admitting: Emergency Medicine

## 2018-12-05 ENCOUNTER — Ambulatory Visit: Admitting: Anesthesiology

## 2018-12-05 ENCOUNTER — Other Ambulatory Visit: Payer: Self-pay

## 2018-12-05 DIAGNOSIS — J358 Other chronic diseases of tonsils and adenoids: Secondary | ICD-10-CM | POA: Diagnosis present

## 2018-12-05 DIAGNOSIS — J3501 Chronic tonsillitis: Secondary | ICD-10-CM | POA: Insufficient documentation

## 2018-12-05 DIAGNOSIS — J95830 Postprocedural hemorrhage and hematoma of a respiratory system organ or structure following a respiratory system procedure: Secondary | ICD-10-CM | POA: Insufficient documentation

## 2018-12-05 DIAGNOSIS — Z79899 Other long term (current) drug therapy: Secondary | ICD-10-CM | POA: Insufficient documentation

## 2018-12-05 DIAGNOSIS — Z87891 Personal history of nicotine dependence: Secondary | ICD-10-CM | POA: Insufficient documentation

## 2018-12-05 HISTORY — DX: Reserved for inherently not codable concepts without codable children: IMO0001

## 2018-12-05 HISTORY — PX: TONSILLECTOMY: SHX5217

## 2018-12-05 HISTORY — DX: Encounter for fitting and adjustment of orthodontic device: Z46.4

## 2018-12-05 HISTORY — DX: Other chronic diseases of tonsils and adenoids: J35.8

## 2018-12-05 LAB — CBC WITH DIFFERENTIAL/PLATELET
Abs Immature Granulocytes: 0.14 10*3/uL — ABNORMAL HIGH (ref 0.00–0.07)
Basophils Absolute: 0 10*3/uL (ref 0.0–0.1)
Basophils Relative: 0 %
Eosinophils Absolute: 0 10*3/uL (ref 0.0–0.5)
Eosinophils Relative: 0 %
HCT: 37.4 % (ref 36.0–46.0)
Hemoglobin: 13 g/dL (ref 12.0–15.0)
Immature Granulocytes: 1 %
Lymphocytes Relative: 7 %
Lymphs Abs: 1.2 10*3/uL (ref 0.7–4.0)
MCH: 33 pg (ref 26.0–34.0)
MCHC: 34.8 g/dL (ref 30.0–36.0)
MCV: 94.9 fL (ref 80.0–100.0)
Monocytes Absolute: 0.6 10*3/uL (ref 0.1–1.0)
Monocytes Relative: 3 %
Neutro Abs: 16.5 10*3/uL — ABNORMAL HIGH (ref 1.7–7.7)
Neutrophils Relative %: 89 %
Platelets: 355 10*3/uL (ref 150–400)
RBC: 3.94 MIL/uL (ref 3.87–5.11)
RDW: 11.8 % (ref 11.5–15.5)
WBC: 18.5 10*3/uL — ABNORMAL HIGH (ref 4.0–10.5)
nRBC: 0 % (ref 0.0–0.2)

## 2018-12-05 LAB — BASIC METABOLIC PANEL
Anion gap: 7 (ref 5–15)
BUN: 13 mg/dL (ref 6–20)
CO2: 20 mmol/L — ABNORMAL LOW (ref 22–32)
Calcium: 8.6 mg/dL — ABNORMAL LOW (ref 8.9–10.3)
Chloride: 104 mmol/L (ref 98–111)
Creatinine, Ser: 0.82 mg/dL (ref 0.44–1.00)
GFR calc Af Amer: >60 mL/min (ref >60)
GFR calc non Af Amer: >60 mL/min (ref >60)
Glucose, Bld: 188 mg/dL — ABNORMAL HIGH (ref 70–99)
Potassium: 3.9 mmol/L (ref 3.5–5.1)
Sodium: 131 mmol/L — ABNORMAL LOW (ref 135–145)

## 2018-12-05 LAB — POCT PREGNANCY, URINE: Preg Test, Ur: NEGATIVE

## 2018-12-05 SURGERY — TONSILLECTOMY
Anesthesia: General | Site: Throat

## 2018-12-05 MED ORDER — SILVER NITRATE-POT NITRATE 75-25 % EX MISC
CUTANEOUS | Status: DC | PRN
  Administered 2018-12-05: 2

## 2018-12-05 MED ORDER — FENTANYL CITRATE (PF) 100 MCG/2ML IJ SOLN
25.0000 ug | INTRAMUSCULAR | Status: DC | PRN
  Administered 2018-12-05: 10:00:00 50 ug via INTRAVENOUS

## 2018-12-05 MED ORDER — ONDANSETRON HCL 4 MG/2ML IJ SOLN
INTRAMUSCULAR | Status: DC | PRN
  Administered 2018-12-05: 4 mg via INTRAVENOUS

## 2018-12-05 MED ORDER — FENTANYL CITRATE (PF) 100 MCG/2ML IJ SOLN
INTRAMUSCULAR | Status: DC | PRN
  Administered 2018-12-05: 100 ug via INTRAVENOUS
  Administered 2018-12-05: 25 ug via INTRAVENOUS

## 2018-12-05 MED ORDER — MIDAZOLAM HCL 5 MG/5ML IJ SOLN
INTRAMUSCULAR | Status: DC | PRN
  Administered 2018-12-05: 2 mg via INTRAVENOUS

## 2018-12-05 MED ORDER — GLYCOPYRROLATE 0.2 MG/ML IJ SOLN
INTRAMUSCULAR | Status: DC | PRN
  Administered 2018-12-05: 0.1 mg via INTRAVENOUS

## 2018-12-05 MED ORDER — LIDOCAINE HCL (CARDIAC) PF 100 MG/5ML IV SOSY
PREFILLED_SYRINGE | INTRAVENOUS | Status: DC | PRN
  Administered 2018-12-05: 40 mg via INTRAVENOUS

## 2018-12-05 MED ORDER — FENTANYL CITRATE (PF) 100 MCG/2ML IJ SOLN: 100.0000 ug | Freq: Once | INTRAMUSCULAR | Status: DC

## 2018-12-05 MED ORDER — ONDANSETRON HCL 4 MG/2ML IJ SOLN: 4.0000 mg | Freq: Once | INTRAMUSCULAR | Status: DC | PRN

## 2018-12-05 MED ORDER — DEXAMETHASONE SODIUM PHOSPHATE 4 MG/ML IJ SOLN
INTRAMUSCULAR | Status: DC | PRN
  Administered 2018-12-05: 10 mg via INTRAVENOUS

## 2018-12-05 MED ORDER — LACTATED RINGERS IV SOLN
INTRAVENOUS | Status: DC
  Administered 2018-12-05: 09:00:00 via INTRAVENOUS

## 2018-12-05 MED ORDER — FENTANYL CITRATE (PF) 100 MCG/2ML IJ SOLN
100.0000 ug | Freq: Once | INTRAMUSCULAR | Status: AC
  Administered 2018-12-05: 100 ug via INTRAVENOUS
  Filled 2018-12-05: qty 2

## 2018-12-05 MED ORDER — LACTATED RINGERS IV SOLN: INTRAVENOUS | Status: DC

## 2018-12-05 MED ORDER — SUCCINYLCHOLINE CHLORIDE 20 MG/ML IJ SOLN
INTRAMUSCULAR | Status: DC | PRN
  Administered 2018-12-05: 80 mg via INTRAVENOUS

## 2018-12-05 MED ORDER — OXYCODONE HCL 5 MG/5ML PO SOLN
5.0000 mg | Freq: Once | ORAL | Status: AC | PRN
  Administered 2018-12-05: 5 mg via ORAL

## 2018-12-05 MED ORDER — ONDANSETRON HCL 4 MG/2ML IJ SOLN
4.0000 mg | Freq: Once | INTRAMUSCULAR | Status: AC
  Administered 2018-12-05: 4 mg via INTRAVENOUS
  Filled 2018-12-05: qty 2

## 2018-12-05 MED ORDER — FENTANYL CITRATE (PF) 100 MCG/2ML IJ SOLN
INTRAMUSCULAR | Status: AC
  Administered 2018-12-05: 100 ug
  Filled 2018-12-05: qty 2

## 2018-12-05 MED ORDER — HYDROCODONE-ACETAMINOPHEN 7.5-325 MG/15ML PO SOLN: 15.0000 mL | Freq: Four times a day (QID) | ORAL | 0 refills | Status: AC | PRN

## 2018-12-05 MED ORDER — PROPOFOL 10 MG/ML IV BOLUS
INTRAVENOUS | Status: DC | PRN
  Administered 2018-12-05: 150 mg via INTRAVENOUS

## 2018-12-05 MED ORDER — ACETAMINOPHEN 10 MG/ML IV SOLN
1000.0000 mg | Freq: Once | INTRAVENOUS | Status: AC
  Administered 2018-12-05: 1000 mg via INTRAVENOUS

## 2018-12-05 MED ORDER — OXYCODONE HCL 5 MG PO TABS: 5.0000 mg | ORAL_TABLET | Freq: Once | ORAL | Status: AC | PRN

## 2018-12-05 SURGICAL SUPPLY — 20 items
"PENCIL ELECTRO HAND CTR " (MISCELLANEOUS) ×1 IMPLANT
BASIN GRAD PLASTIC 32OZ STRL (MISCELLANEOUS) ×3 IMPLANT
BLADE BOVIE TIP EXT 4 (BLADE) ×3 IMPLANT
CANISTER SUCT 1200ML W/VALVE (MISCELLANEOUS) ×3 IMPLANT
ELECT CAUTERY BLADE TIP 2.5 (TIP) ×3
ELECT REM PT RETURN 9FT ADLT (ELECTROSURGICAL) ×3
ELECTRODE CAUTERY BLDE TIP 2.5 (TIP) ×1 IMPLANT
ELECTRODE REM PT RTRN 9FT ADLT (ELECTROSURGICAL) ×1 IMPLANT
GLOVE PI ULTRA LF STRL 7.5 (GLOVE) ×1 IMPLANT
GLOVE PI ULTRA NON LATEX 7.5 (GLOVE) ×2
KIT TURNOVER KIT A (KITS) ×3 IMPLANT
PACK TONSIL AND ADENOID CUSTOM (PACKS) ×3 IMPLANT
PENCIL ELECTRO HAND CTR (MISCELLANEOUS) ×3 IMPLANT
PENCIL SMOKE EVACUATOR (MISCELLANEOUS) ×3 IMPLANT
SLEEVE SUCTION 125 (MISCELLANEOUS) ×3 IMPLANT
SOL ANTI-FOG 6CC FOG-OUT (MISCELLANEOUS) ×1 IMPLANT
SOL FOG-OUT ANTI-FOG 6CC (MISCELLANEOUS) ×2
SPONGE TONSIL .75 RFD DBL STRL (DISPOSABLE) ×2 IMPLANT
SPONGE TONSIL TAPE 1 RFD (DISPOSABLE) IMPLANT
STRAP BODY AND KNEE 60X3 (MISCELLANEOUS) ×3 IMPLANT

## 2018-12-05 NOTE — ED Notes (Signed)
Pt denies having spit up anymore blood in the last 2 hours. Pt given another cup of iced water.

## 2018-12-05 NOTE — ED Notes (Signed)
Pt given pillow.

## 2018-12-05 NOTE — H&P (Signed)
H&P has been reviewed and patient reevaluated, no changes necessary. To be downloaded later.  

## 2018-12-05 NOTE — Anesthesia Procedure Notes (Signed)
Procedure Name: Intubation Date/Time: 12/05/2018 9:38 AM Performed by: Mayme Genta, CRNA Pre-anesthesia Checklist: Patient identified, Emergency Drugs available, Suction available, Patient being monitored and Timeout performed Patient Re-evaluated:Patient Re-evaluated prior to induction Oxygen Delivery Method: Circle system utilized Preoxygenation: Pre-oxygenation with 100% oxygen Induction Type: IV induction Ventilation: Mask ventilation without difficulty Laryngoscope Size: Miller and 2 Grade View: Grade I Tube type: Oral Rae Tube size: 7.0 mm Number of attempts: 1 Placement Confirmation: ETT inserted through vocal cords under direct vision,  positive ETCO2 and breath sounds checked- equal and bilateral Tube secured with: Tape Dental Injury: Teeth and Oropharynx as per pre-operative assessment

## 2018-12-05 NOTE — Discharge Instructions (Signed)
Please follow up with Dr. Kathyrn Sheriff tomorrow.  Return to the ER if you have more bleeding and ice water does not help.

## 2018-12-05 NOTE — ED Notes (Addendum)
NP at bedside.

## 2018-12-05 NOTE — ED Notes (Signed)
Pt's family/ride given brief update.

## 2018-12-05 NOTE — ED Notes (Addendum)
Sending lav/grn tubes to lab. Pt given warm blanket x2. Pt requesting pain med.

## 2018-12-05 NOTE — Op Note (Signed)
12/05/2018  10:09 AM    Quincy Carnes  [id number]   Pre-Op Dx: Chronic tonsillitis  Post-op Dx: Chronic tonsillitis  Proc: Tonsillectomy  Surg:  Elon Alas Talulah Schirmer  Anes:  GOT  EBL: 30 mL  Comp: None  Findings: Small but very cryptic and inflamed tonsils.  They were very scarred down the underlying muscle.  Took a while to get bleeding controlled especially from her left inferior tonsil near the tongue base.   Procedure: The patient was brought to the operating room and placed in supine position.  She was given general anesthesia by oral endotracheal intubation.  A Shaune Pascal was used to visualize the oropharynx.  Her tonsils were relatively flat but were cryptic and had some debris in the tonsils.  Her soft palate was retracted to visualize the adenoids and these were flat and there was no obstruction in the nasopharynx.  The left tonsil was grasped and pulled medially.  The anterior tonsillar pillar was incised.  The tonsil was dissected from its fossa using blunt dissection and electrocautery.  This was densely adherent to the underlying muscle and had to be removed from that.  There is a significant amount of ooze from multiple areas that was controlled with electrocautery.  There was further bleeding at the tongue base where the inferior tonsil was removed.  This was controlled with electrocautery and direct pressure.  Some silver nitrate was used here with a sponge putting pressure make sure that this area was very dry.  The right tonsil was then pulled medially and the anterior pillar incised.  The tonsil was dissected from its fossa using blunt dissection and electrocautery.  The side did not have as much bleeding as the other side.  Bleeding was controlled with electrocautery and some silver nitrate.  The patient tolerated the procedure well.  She was awakened and taken to the recovery room in satisfactory condition.  There were no operative complications.    Dispo:   To PACU  to be discharged home  Plan: To follow-up in the office in a couple weeks to make sure she is doing well and is healed well.  She will push liquids at home.  I written for some liquid Tylenol with hydrocodone to use for pain.  Elon Alas Radek Carnero  12/05/2018 10:09 AM

## 2018-12-05 NOTE — ED Notes (Signed)
Lights dimmed for pt 

## 2018-12-05 NOTE — ED Notes (Signed)
2nd pink tube sent on pt.

## 2018-12-05 NOTE — ED Triage Notes (Signed)
Pt presents from home via [facility] with c/o coughing up blood after tonsillectomy. Pt had tonsillectomy today and was discharged home. Pt states she has been coughing up blood for the last hour. Pt alert and oriented at this time. Pt c/o 8/10 pain at this time.

## 2018-12-05 NOTE — Anesthesia Postprocedure Evaluation (Signed)
Anesthesia Post Note  Patient: Krystal Lawrence  Procedure(s) Performed: TONSILLECTOMY (N/A Throat)  Patient location during evaluation: PACU Anesthesia Type: General Level of consciousness: awake and alert, oriented and patient cooperative Pain management: pain level controlled Vital Signs Assessment: post-procedure vital signs reviewed and stable Respiratory status: spontaneous breathing, nonlabored ventilation and respiratory function stable Cardiovascular status: blood pressure returned to baseline and stable Postop Assessment: adequate PO intake Anesthetic complications: no    Darrin Nipper

## 2018-12-05 NOTE — Transfer of Care (Signed)
Immediate Anesthesia Transfer of Care Note  Patient: Krystal Lawrence  Procedure(s) Performed: TONSILLECTOMY (N/A Throat)  Patient Location: PACU  Anesthesia Type: General  Level of Consciousness: awake, alert  and patient cooperative  Airway and Oxygen Therapy: Patient Spontanous Breathing and Patient connected to supplemental oxygen  Post-op Assessment: Post-op Vital signs reviewed, Patient's Cardiovascular Status Stable, Respiratory Function Stable, Patent Airway and No signs of Nausea or vomiting  Post-op Vital Signs: Reviewed and stable  Complications: No apparent anesthesia complications

## 2018-12-05 NOTE — ED Notes (Signed)
Pt's family dropped off some belongings. Belongings given to pt. Pt currently spitting blood into skin in room; pt sitting in chair.

## 2018-12-05 NOTE — ED Provider Notes (Signed)
[facility] Emergency Department Provider Note  ____________________________________________  Time seen: Approximately 7:53 PM  I have reviewed the triage vital signs and the nursing notes.   HISTORY  Chief Complaint Hemoptysis    HPI Krystal Lawrence is a 43 y.o. female presents to the emergency department due to to post tonsillectomy bleeding.  Procedure was performed here earlier today.  Patient went home and was doing well until about an hour prior to arrival.   She states that bleeding started randomly and has not stopped since.  She states that she did contact Dr. Maisie Fus office and was advised to come to the emergency department.  Past Medical History:  Diagnosis Date  . Environmental and seasonal allergies   . Herpes   . Orthodontics    removable braces  . Tonsil stone     There are no active problems to display for this patient.   Past Surgical History:  Procedure Laterality Date  . APPENDECTOMY    . CHOLECYSTECTOMY    . SALPINGECTOMY      Prior to Admission medications   Medication Sig Start Date End Date Taking? Authorizing Provider  cetirizine (ZYRTEC) 10 MG tablet Take 10 mg by mouth daily.    [provider]  HYDROcodone-acetaminophen (HYCET) 7.5-325 mg/15 ml solution Take 15 mLs by mouth 4 (four) times daily as needed for moderate pain. 12/05/18 12/05/19  Margaretha Sheffield, MD  valACYclovir (VALTREX) 1000 MG tablet Take 1 tablet (1,000 mg total) by mouth daily. 11/30/17   Rod Can, CNM    Allergies Lactose intolerance (gi)  Family History  Problem Relation Age of Onset  . Cancer Mother   . Hypertension Mother   . Alcoholism Father   . Diabetes Father     Social History Social History   Tobacco Use  . Smoking status: Former Smoker    Packs/day: 2.00    Years: 10.00    Pack years: 20.00    Types: Cigarettes    Quit date: 10/30/2007    Years since quitting: 11.1  . Smokeless tobacco: Never Used  Substance  Use Topics  . Alcohol use: Yes    Alcohol/week: 4.0 standard drinks    Types: 4 Cans of beer per week    Comment: less than 1 per day  . Drug use: No    Review of Systems Constitutional: Negative for fever. Eyes: No visual changes. ENT: Positive for posterior oropharyngeal bleeding. Respiratory: Denies shortness of breath. Gastrointestinal: Negative for abdominal pain.  No nausea, no vomiting.  No diarrhea.  Genitourinary: Negative for dysuria.  Negative for decrease in need to void. Musculoskeletal: Negative for generalized body aches. Skin: Negative for rash. Neurological: Negative for headaches, negative for focal weakness or numbness.  ____________________________________________   PHYSICAL EXAM:  VITAL SIGNS: ED Triage Vitals  Enc Vitals Group     BP 12/05/18 1918 (!) 161/98     Pulse Rate 12/05/18 1918 (!) 110     Resp 12/05/18 1918 20     Temp 12/05/18 1918 97.8 F (36.6 C)     Temp Source 12/05/18 1918 Axillary     SpO2 12/05/18 1918 100 %     Weight 12/05/18 1919 175 lb (79.4 kg)     Height 12/05/18 1919 5\' 7"  (1.702 m)     Head Circumference --      Peak Flow --      Pain Score 12/05/18 1919 8     Pain Loc --  Pain Edu? --      Excl. in GC? --     Constitutional: Alert and oriented. Well appearing and in no acute distress. Eyes: Conjunctivae are normal.  Head: Atraumatic. Nose: No congestion/rhinnorhea. Mouth/Throat: Mucous membranes are moist.  Oropharynx with scant blood in the posterior, tonsils absent. Uvula is midline. Neck: No stridor. Voice as expected post tonsillectomy. Lymphatic: Anterior cervical nodes nontender Cardiovascular: Normal rate, regular rhythm. Good peripheral circulation. Respiratory: Normal respiratory effort. Lungs CTAB. Gastrointestinal: Soft and nontender. Musculoskeletal: FROM of neck, upper and lower extremities. Neurologic:  Normal speech and language. No gross focal neurologic deficits are appreciated. Skin:  Skin  is warm, dry and intact. No rash noted Psychiatric: Mood and affect are normal. Speech and behavior are normal.  ____________________________________________   LABS (all labs ordered are listed, but only abnormal results are displayed)  Labs Reviewed  BASIC METABOLIC PANEL - Abnormal; Notable for the following components:      Result Value   Sodium 131 (*)    CO2 20 (*)    Glucose, Bld 188 (*)    Calcium 8.6 (*)    All other components within normal limits  CBC WITH DIFFERENTIAL/PLATELET - Abnormal; Notable for the following components:   WBC 18.5 (*)    Neutro Abs 16.5 (*)    Abs Immature Granulocytes 0.14 (*)    All other components within normal limits  TYPE AND SCREEN   ____________________________________________  EKG  Not indiated ____________________________________________  RADIOLOGY  Not indicated. ____________________________________________   PROCEDURES  Procedure(s) performed: None  Critical Care performed: No ____________________________________________   INITIAL IMPRESSION / ASSESSMENT AND PLAN / ED COURSE  43 year old female presenting to the emergency department due to bleeding post tonsillectomy.  Small amount of bright red blood is noted in the emesis bag.  It appears that the blood is coming from the anterior edge of the surgical site on the right side.  We will attempt to have her gargle ice water in hopes that bleeding will stop on its own.  She did not try this at home prior to arrival.  Madolyn Friezecewater has controlled the bleeding for over 2 hours.  She has had no recurrence of bleeding and does not feel anything running down the back of her throat.  She was medicated with fentanyl which provided significant relief and states that she is able to swallow much easier.  At this point, I feel that the patient is safe to be discharged home and she feels comfortable with that plan as well.  She was advised that if the bleeding returns is not well controlled  with ice water that she is to return to the emergency department.  Either way, she is to call Dr. Wyn ForsterJuengel's office tomorrow just updated him on her progress.  Pertinent labs & imaging results that were available during my care of the patient were reviewed by me and considered in my medical decision making (see chart for details). ____________________________________________  Discharge Medication List as of 12/05/2018 10:51 PM      FINAL CLINICAL IMPRESSION(S) / ED DIAGNOSES  Final diagnoses:  Post-tonsillectomy hemorrhage    If controlled substance prescribed during this visit, 12 month history viewed on the [facility] prior to issuing an initial prescription for Schedule II or III opiod.   Note:  This document was prepared using Dragon voice recognition software and may include unintentional dictation errors.   Chinita Pesterriplett, Carl Butner B, FNP 12/06/18 0105    Jeanmarie PlantMcShane, James A, MD 12/06/18 [phone]

## 2018-12-05 NOTE — ED Notes (Signed)
Pt's IV removed.

## 2018-12-05 NOTE — ED Notes (Signed)
Pt coughing again but has yet to spit up blood again. Pt states throat sore but feels much better than earlier.

## 2018-12-06 ENCOUNTER — Encounter: Payer: Self-pay | Admitting: Otolaryngology

## 2018-12-09 LAB — SURGICAL PATHOLOGY

## 2020-05-28 ENCOUNTER — Other Ambulatory Visit: Payer: Self-pay

## 2020-05-28 ENCOUNTER — Ambulatory Visit
Admission: EM | Admit: 2020-05-28 | Discharge: 2020-05-28 | Disposition: A | Discharge status: Home / Self Care | Attending: Family Medicine | Admitting: Family Medicine

## 2020-05-28 ENCOUNTER — Encounter: Payer: Self-pay | Admitting: Emergency Medicine

## 2020-05-28 DIAGNOSIS — U071 COVID-19: Secondary | ICD-10-CM | POA: Insufficient documentation

## 2020-05-28 LAB — RESP PANEL BY RT-PCR (FLU A&B, COVID) ARPGX2
Influenza A by PCR: NEGATIVE
Influenza B by PCR: NEGATIVE
SARS Coronavirus 2 by RT PCR: POSITIVE — AB

## 2020-05-28 MED ORDER — KETOROLAC TROMETHAMINE 10 MG PO TABS: 10.0000 mg | ORAL_TABLET | Freq: Four times a day (QID) | ORAL | 0 refills | Status: AC | PRN

## 2020-05-28 NOTE — ED Triage Notes (Signed)
Patient c/o headache, bodyaches, diarrhea, and congestion that started yesterday.  Patient reports low grade fevers.

## 2020-05-28 NOTE — ED Provider Notes (Addendum)
[facility]    CSN: [id number] Arrival date & time: 05/28/20  1227      History   Chief Complaint Chief Complaint  Patient presents with  . Generalized Body Aches  . Diarrhea  . Fever   HPI  44 year old female presents with the above complaints.  Symptoms started yesterday.  Patient reports headache, body aches, diarrhea, fever, and sore throat.  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  Pain 5/10 in severity.  She has not taken any medication for her symptoms.  Temperature currently 99.5.  No relieving factors.  No other associated symptoms.  No other complaints.  Past Medical History:  Diagnosis Date  . Environmental and seasonal allergies   . Herpes   . Orthodontics    removable braces  . Tonsil stone    Past Surgical History:  Procedure Laterality Date  . APPENDECTOMY    . CHOLECYSTECTOMY    . SALPINGECTOMY    . TONSILLECTOMY N/A 12/05/2018   Procedure: TONSILLECTOMY;  Surgeon: Vernie Murders, MD;  Location: [facility];  Service: ENT;  Laterality: N/A;   OB History   No obstetric history on file.    Home Medications    Prior to Admission medications   Medication Sig Start Date End Date Taking? Authorizing Provider  cetirizine (ZYRTEC) 10 MG tablet Take 10 mg by mouth daily.   Yes [provider]  valACYclovir (VALTREX) 1000 MG tablet Take 1 tablet (1,000 mg total) by mouth daily. 11/30/17  Yes Tresea Mall, CNM  ketorolac (TORADOL) 10 MG tablet Take 1 tablet (10 mg total) by mouth every 6 (six) hours as needed for moderate pain or severe pain. 05/28/20   Tommie Sams, DO    Family History Family History  Problem Relation Age of Onset  . Cancer Mother   . Hypertension Mother   . Alcoholism Father   . Diabetes Father     Social History Social History   Tobacco Use  . Smoking status: Former Smoker    Packs/day: 2.00    Years: 10.00    Pack years: 20.00    Types: Cigarettes    Quit date: 10/30/2007     Years since quitting: 12.5  . Smokeless tobacco: Never Used  Vaping Use  . Vaping Use: Never used  Substance Use Topics  . Alcohol use: Yes    Alcohol/week: 4.0 standard drinks    Types: 4 Cans of beer per week    Comment: less than 1 per day  . Drug use: No     Allergies   Lactose intolerance (gi)   Review of Systems Review of Systems Per HPI  Physical Exam Triage Vital Signs ED Triage Vitals  Enc Vitals Group     BP 05/28/20 1241 137/83     Pulse Rate 05/28/20 1241 99     Resp 05/28/20 1241 14     Temp 05/28/20 1241 99.5 F (37.5 C)     Temp Source 05/28/20 1241 Oral     SpO2 05/28/20 1241 99 %     Weight 05/28/20 1237 161 lb 3.2 oz (73.1 kg)     Height 05/28/20 1237 5\' 4"  (1.626 m)     Head Circumference --      Peak Flow --      Pain Score 05/28/20 1237 5     Pain Loc --      Pain Edu? --      Excl. in GC? --  Updated Vital Signs BP 137/83 (BP Location: Right Arm)   Pulse 99   Temp 99.5 F (37.5 C) (Oral)   Resp 14   Ht 5\' 4"  (1.626 m)   Wt 73.1 kg   LMP 05/07/2020 (Approximate)   SpO2 99%   BMI 27.67 kg/m   Visual Acuity Right Eye Distance:   Left Eye Distance:   Bilateral Distance:    Right Eye Near:   Left Eye Near:    Bilateral Near:     Physical Exam Vitals and nursing note reviewed.  Constitutional:      General: She is not in acute distress.    Appearance: Normal appearance. She is not ill-appearing.  HENT:     Head: Normocephalic and atraumatic.     Mouth/Throat:     Pharynx: Oropharynx is clear. No oropharyngeal exudate.  Cardiovascular:     Rate and Rhythm: Normal rate and regular rhythm.     Heart sounds: No murmur heard.   Pulmonary:     Effort: Pulmonary effort is normal.     Breath sounds: Normal breath sounds. No wheezing, rhonchi or rales.  Neurological:     Mental Status: She is alert.  Psychiatric:        Mood and Affect: Mood normal.        Behavior: Behavior normal.    UC Treatments / Results  Labs (all  labs ordered are listed, but only abnormal results are displayed) Labs Reviewed  RESP PANEL BY RT-PCR (FLU A&B, COVID) ARPGX2 - Abnormal; Notable for the following components:      Result Value   SARS Coronavirus 2 by RT PCR POSITIVE (*)    All other components within normal limits    EKG   Radiology No results found.  Procedures Procedures (including critical care time)  Medications Ordered in UC Medications - No data to display  Initial Impression / Assessment and Plan / UC Course  I have reviewed the triage vital signs and the nursing notes.  Pertinent labs & imaging results that were available during my care of the patient were reviewed by me and considered in my medical decision making (see chart for details).    44 year old female presents with the above symptoms. Acute illness with systemic symptoms.  COVID +. Info sent to infusion clinic. Toradol for pain. Supportive care.   Final Clinical Impressions(s) / UC Diagnoses   Final diagnoses:  COVID     Discharge Instructions     Rest.  Lots of fluids.  Medication as needed for pain/body aches.  Stay home.  Infusion clinic will call.  Take care  Dr 54    ED Prescriptions    Medication Sig Dispense Auth. Provider   ketorolac (TORADOL) 10 MG tablet Take 1 tablet (10 mg total) by mouth every 6 (six) hours as needed for moderate pain or severe pain. 20 tablet Adriana Simas, DO     PDMP not reviewed this encounter.   Tommie Sams, Tommie Sams 05/28/20 1412

## 2020-05-28 NOTE — Discharge Instructions (Addendum)
Rest.  Lots of fluids.  Medication as needed for pain/body aches.  Stay home.  Infusion clinic will call.  Take care  Dr Adriana Simas

## 2020-05-29 ENCOUNTER — Other Ambulatory Visit (HOSPITAL_COMMUNITY): Payer: Self-pay | Admitting: Family

## 2020-05-29 DIAGNOSIS — U071 COVID-19: Secondary | ICD-10-CM

## 2020-05-29 NOTE — Progress Notes (Signed)
I connected by phone with Krystal Lawrence on 05/29/2020 at 7:28 PM to discuss the potential use of a new treatment for mild to moderate COVID-19 viral infection in non-hospitalized patients.  This patient is a 44 y.o. female that meets the FDA criteria for Emergency Use Authorization of COVID monoclonal antibody casirivimab/imdevimab, bamlanivimab/eteseviamb, or sotrovimab.  Has a (+) direct SARS-CoV-2 viral test result  Has mild or moderate COVID-19   Is NOT hospitalized due to COVID-19  Is within 10 days of symptom onset  Has at least one of the high risk factor(s) for progression to severe COVID-19 and/or hospitalization as defined in EUA.  Specific high risk criteria : BMI > 25   Symptoms of H/A, aches, fever, diarrhea began 05/27/20.   I have spoken and communicated the following to the patient or parent/caregiver regarding COVID monoclonal antibody treatment:  1. FDA has authorized the emergency use for the treatment of mild to moderate COVID-19 in adults and pediatric patients with positive results of direct SARS-CoV-2 viral testing who are 45 years of age and older weighing at least 40 kg, and who are at high risk for progressing to severe COVID-19 and/or hospitalization.  2. The significant known and potential risks and benefits of COVID monoclonal antibody, and the extent to which such potential risks and benefits are unknown.  3. Information on available alternative treatments and the risks and benefits of those alternatives, including clinical trials.  4. Patients treated with COVID monoclonal antibody should continue to self-isolate and use infection control measures (e.g., wear mask, isolate, social distance, avoid sharing personal items, clean and disinfect "high touch" surfaces, and frequent handwashing) according to CDC guidelines.   5. The patient or parent/caregiver has the option to accept or refuse COVID monoclonal antibody treatment.  After reviewing this  information with the patient, the patient has agreed to receive one of the available covid 19 monoclonal antibodies and will be provided an appropriate fact sheet prior to infusion. Morton Stall, NP 05/29/2020 7:28 PM

## 2020-05-31 ENCOUNTER — Telehealth: Payer: Self-pay | Admitting: Physician Assistant

## 2020-05-31 NOTE — Telephone Encounter (Signed)
    Called to notify us that she will not be coming to her [facility] infusion apt due to insurance coverage at our institution. Her apt has been cancelled.   Cline Crock PA-C  [facility]

## 2021-02-17 ENCOUNTER — Other Ambulatory Visit: Payer: Self-pay | Admitting: Family Medicine

## 2021-02-17 DIAGNOSIS — Z1231 Encounter for screening mammogram for malignant neoplasm of breast: Secondary | ICD-10-CM

## 2021-03-02 ENCOUNTER — Ambulatory Visit
Admission: RE | Admit: 2021-03-02 | Discharge: 2021-03-02 | Disposition: A | Discharge status: Home / Self Care | Source: Ambulatory Visit | Attending: Family Medicine | Admitting: Family Medicine

## 2021-03-02 ENCOUNTER — Other Ambulatory Visit: Payer: Self-pay

## 2021-03-02 DIAGNOSIS — Z1231 Encounter for screening mammogram for malignant neoplasm of breast: Secondary | ICD-10-CM | POA: Insufficient documentation

## 2021-03-15 ENCOUNTER — Other Ambulatory Visit: Payer: Self-pay | Admitting: Family Medicine

## 2021-03-15 DIAGNOSIS — R928 Other abnormal and inconclusive findings on diagnostic imaging of breast: Secondary | ICD-10-CM

## 2021-03-15 DIAGNOSIS — N6310 Unspecified lump in the right breast, unspecified quadrant: Secondary | ICD-10-CM

## 2021-03-25 ENCOUNTER — Ambulatory Visit
Admission: RE | Admit: 2021-03-25 | Discharge: 2021-03-25 | Disposition: A | Discharge status: Home / Self Care | Source: Ambulatory Visit | Attending: Family Medicine | Admitting: Family Medicine

## 2021-03-25 ENCOUNTER — Other Ambulatory Visit: Payer: Self-pay

## 2021-03-25 DIAGNOSIS — R928 Other abnormal and inconclusive findings on diagnostic imaging of breast: Secondary | ICD-10-CM | POA: Diagnosis not present

## 2021-03-25 DIAGNOSIS — N6310 Unspecified lump in the right breast, unspecified quadrant: Secondary | ICD-10-CM | POA: Diagnosis present

## 2022-02-23 ENCOUNTER — Other Ambulatory Visit: Payer: Self-pay | Admitting: Family Medicine

## 2022-02-23 DIAGNOSIS — Z1231 Encounter for screening mammogram for malignant neoplasm of breast: Secondary | ICD-10-CM

## 2022-03-24 ENCOUNTER — Ambulatory Visit
Admission: RE | Admit: 2022-03-24 | Discharge: 2022-03-24 | Disposition: A | Discharge status: Home / Self Care | Payer: BC Managed Care – PPO | Source: Ambulatory Visit | Attending: Family Medicine | Admitting: Family Medicine

## 2022-03-24 DIAGNOSIS — Z1231 Encounter for screening mammogram for malignant neoplasm of breast: Secondary | ICD-10-CM | POA: Diagnosis not present

## 2022-12-11 ENCOUNTER — Ambulatory Visit: Payer: BC Managed Care – PPO

## 2022-12-11 DIAGNOSIS — K640 First degree hemorrhoids: Secondary | ICD-10-CM

## 2022-12-11 DIAGNOSIS — Z1211 Encounter for screening for malignant neoplasm of colon: Secondary | ICD-10-CM

## 2023-05-31 ENCOUNTER — Other Ambulatory Visit: Payer: Self-pay | Admitting: Family Medicine

## 2023-05-31 DIAGNOSIS — Z1231 Encounter for screening mammogram for malignant neoplasm of breast: Secondary | ICD-10-CM

## 2023-06-21 ENCOUNTER — Ambulatory Visit
Admission: RE | Admit: 2023-06-21 | Discharge: 2023-06-21 | Disposition: A | Discharge status: Home / Self Care | Payer: BC Managed Care – PPO | Source: Ambulatory Visit | Attending: Family Medicine | Admitting: Family Medicine

## 2023-06-21 DIAGNOSIS — Z1231 Encounter for screening mammogram for malignant neoplasm of breast: Secondary | ICD-10-CM | POA: Diagnosis present

## 2023-09-29 IMAGING — US US BREAST*R* LIMITED INC AXILLA
1 series · 7 of 7 positions shown · non-contrast
Comparison: Previous exam(s).

CLINICAL DATA: Patient returns after screening study for evaluation
possible RIGHT breast mass.

EXAM:
DIGITAL DIAGNOSTIC UNILATERAL RIGHT MAMMOGRAM WITH TOMOSYNTHESIS AND
CAD; ULTRASOUND RIGHT BREAST LIMITED
TECHNIQUE: Right digital diagnostic mammography and breast tomosynthesis was
performed. The images were evaluated with computer-aided detection.;
Targeted ultrasound examination of the right breast was performed

[Series 1: us breast*right* limited inc axilla · 0.06mm/px · 7 of 7 slices shown]
[im 1/7]
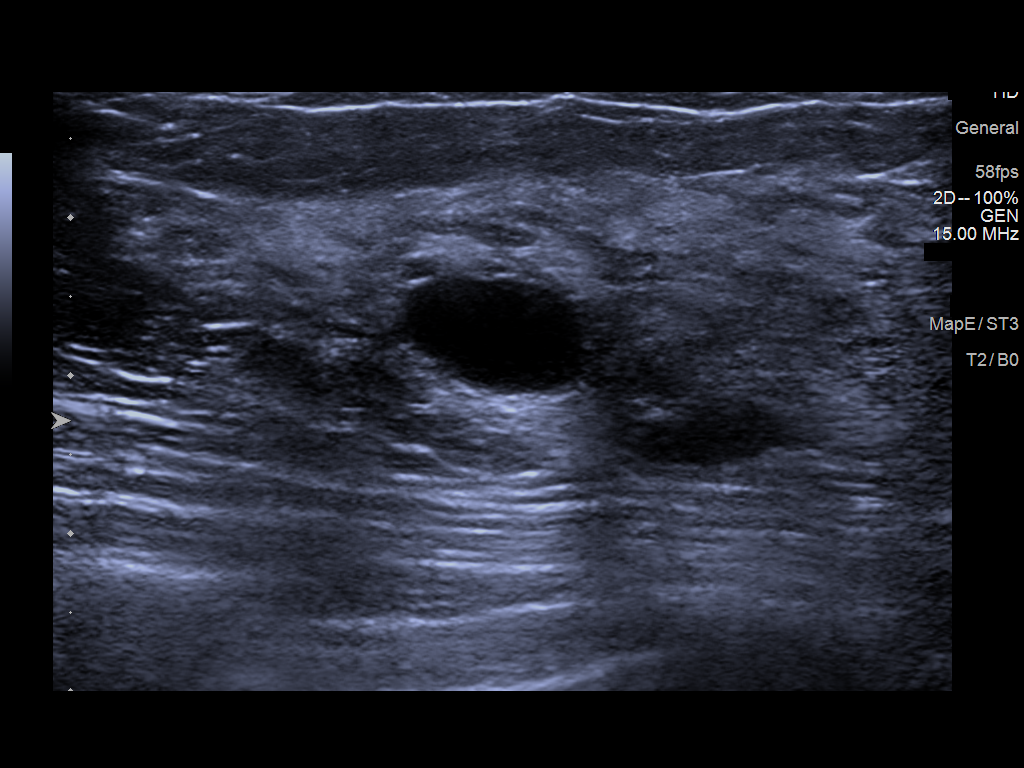
[im 2/7]
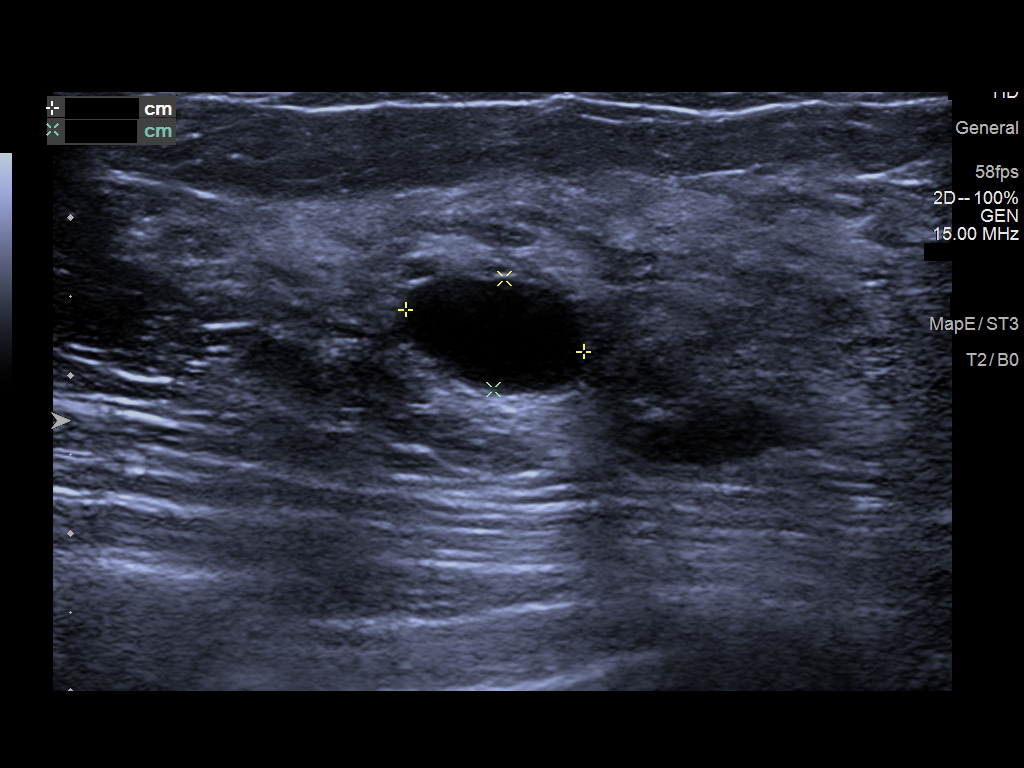
[im 3/7]
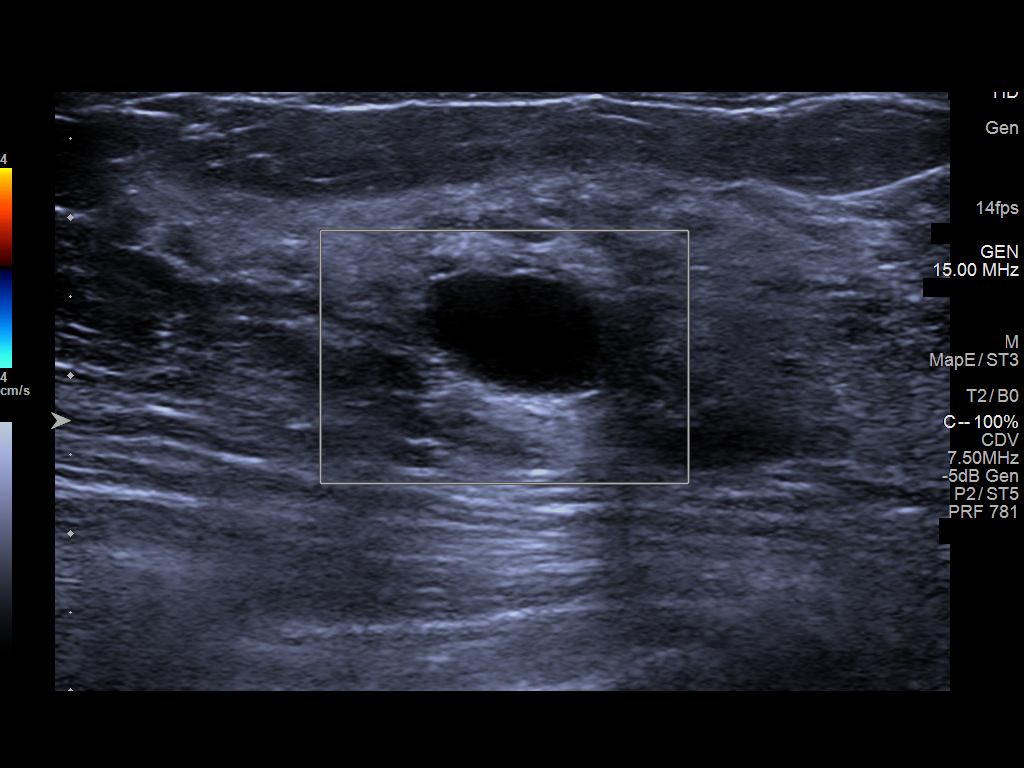
[im 4/7]
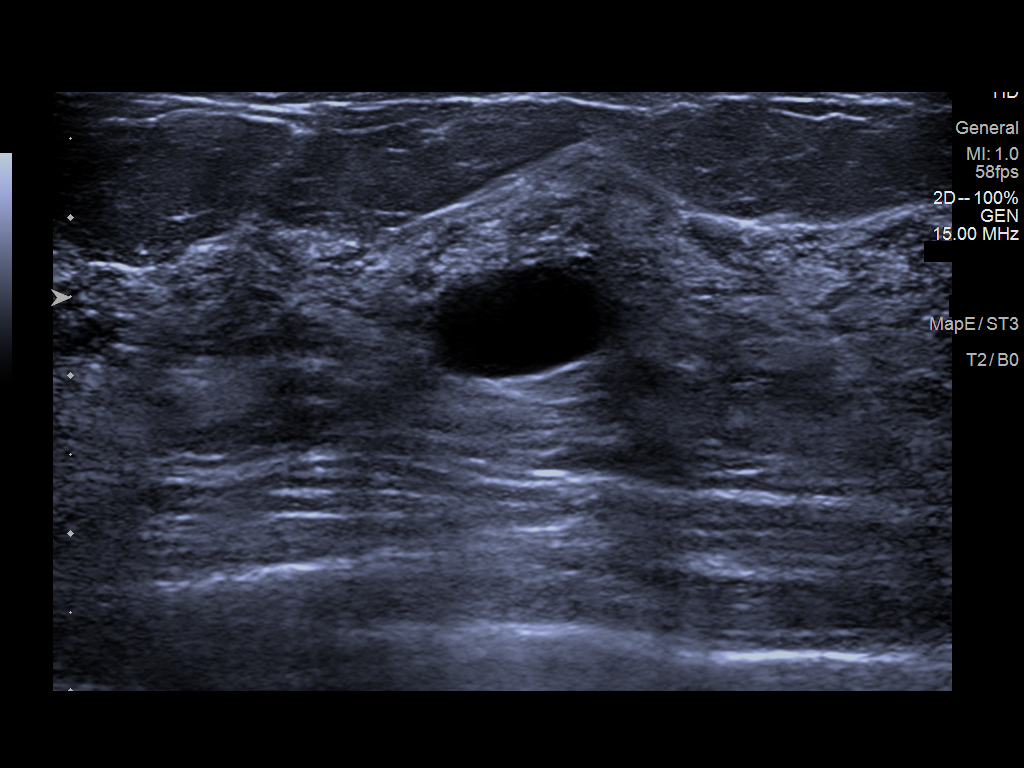
[im 5/7]
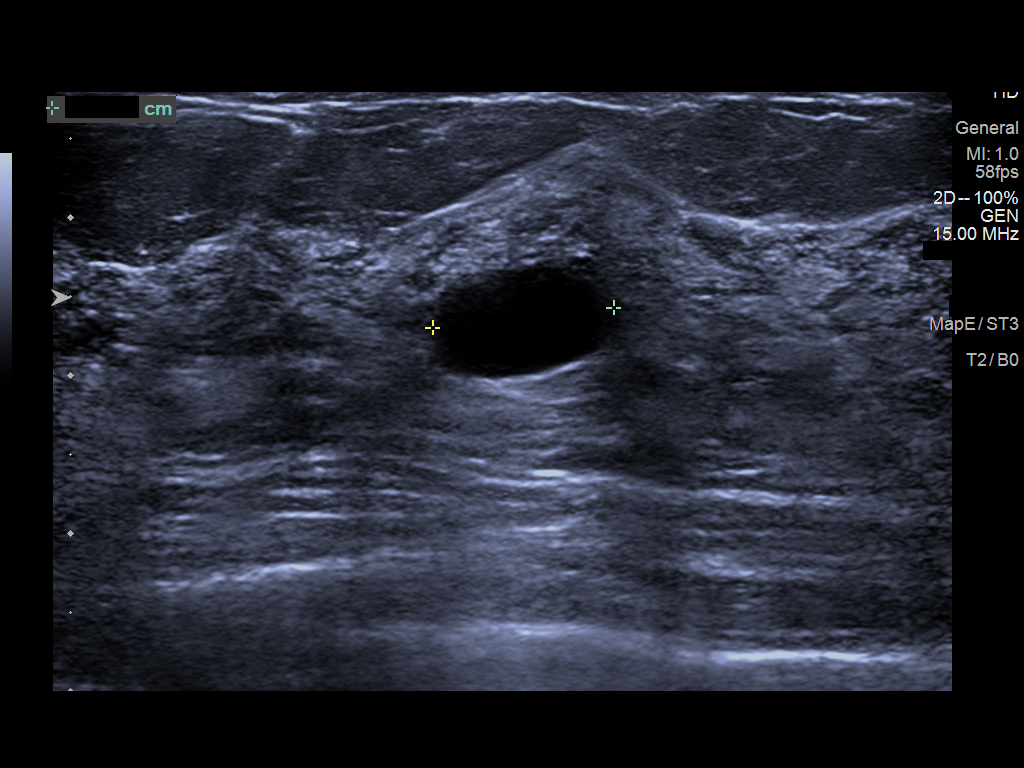
[im 6/7]
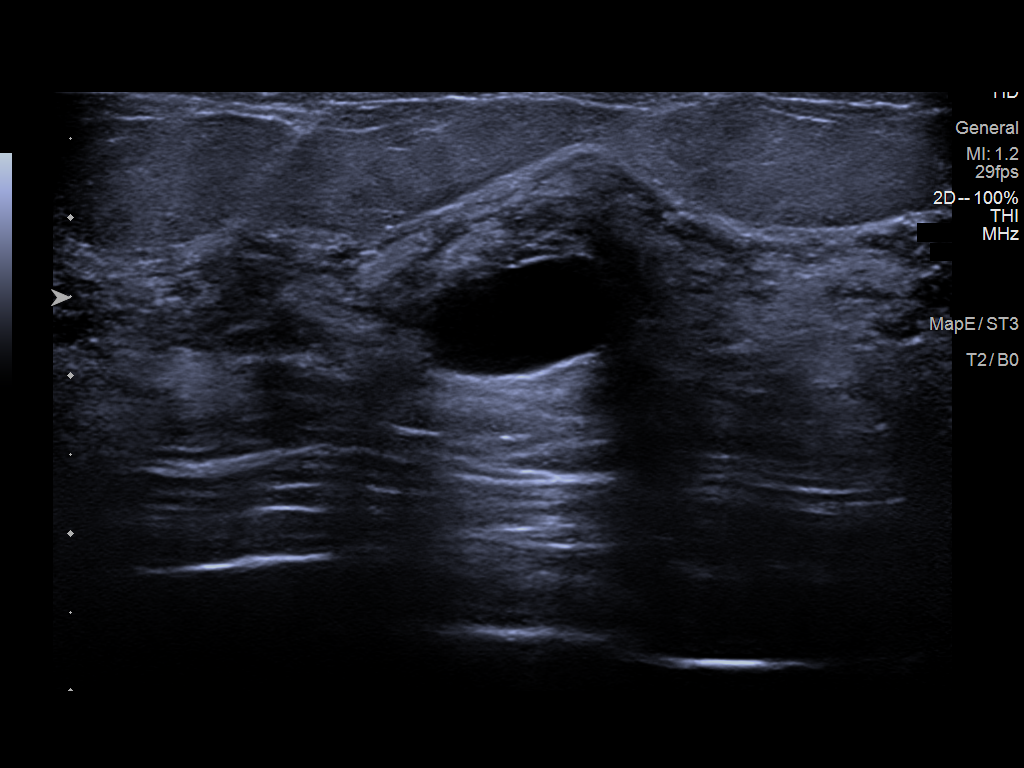
[im 7/7]
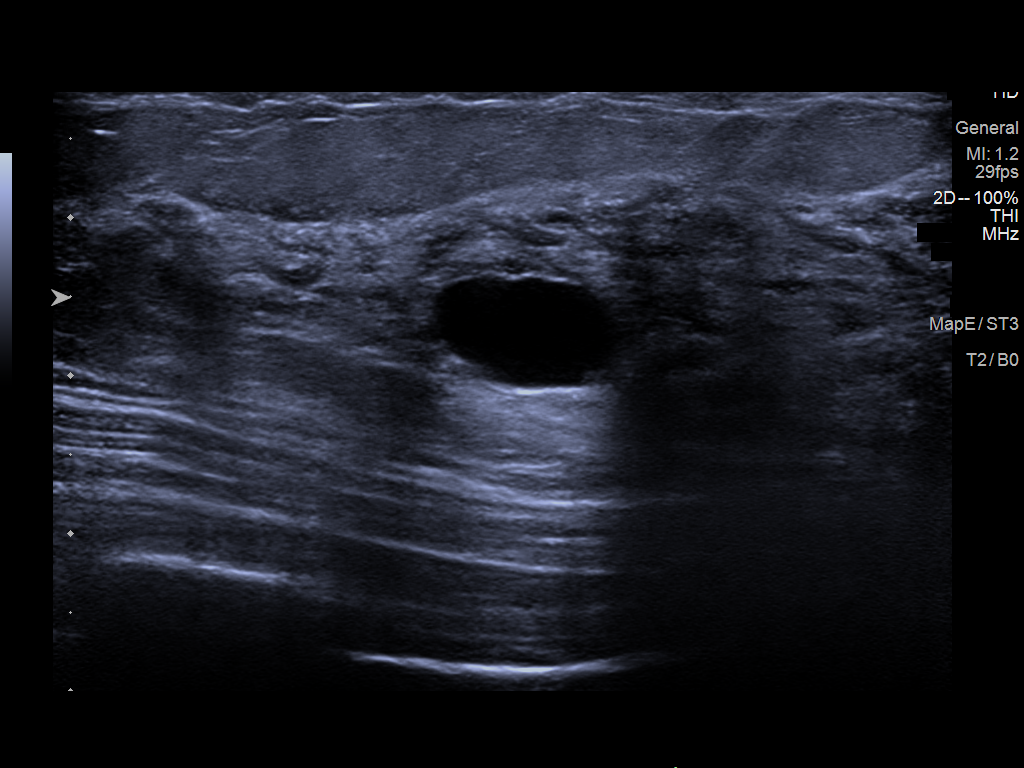

[7 of 7 positions shown; findings below may reference images not displayed]

ACR Breast Density Category c: The breast tissue is heterogeneously
dense, which may obscure small masses.
FINDINGS: Additional 2-D and 3-D images are performed. These views confirm
presence of a partially obscured mass in the UPPER aspect of the
RIGHT breast.

Targeted ultrasound is performed, showing a simple cyst in the 11:30
o'clock location of the RIGHT breast 4 centimeters from the nipple
measuring 1.2 x 0.7 x 1.2 centimeters. No solid mass or areas of
acoustic shadowing.
IMPRESSION: Benign cyst in the RIGHT breast. No mammographic or ultrasound
evidence for malignancy.

RECOMMENDATION:
Screening mammogram in one year.(Code:OT-X-835)

I have discussed the findings and recommendations with the patient
and her husband. If applicable, a reminder letter will be sent to
the patient regarding the next appointment.

BI-RADS CATEGORY  2: Benign.

## 2023-09-29 IMAGING — MG MM DIGITAL DIAGNOSTIC UNILAT*R* W/ TOMO W/ CAD
4 series · 4 of 12 positions shown · non-contrast
Comparison: Previous exam(s).

CLINICAL DATA: Patient returns after screening study for evaluation
possible RIGHT breast mass.

EXAM:
DIGITAL DIAGNOSTIC UNILATERAL RIGHT MAMMOGRAM WITH TOMOSYNTHESIS AND
CAD; ULTRASOUND RIGHT BREAST LIMITED
TECHNIQUE: Right digital diagnostic mammography and breast tomosynthesis was
performed. The images were evaluated with computer-aided detection.;
Targeted ultrasound examination of the right breast was performed

[R ML synth-2D]
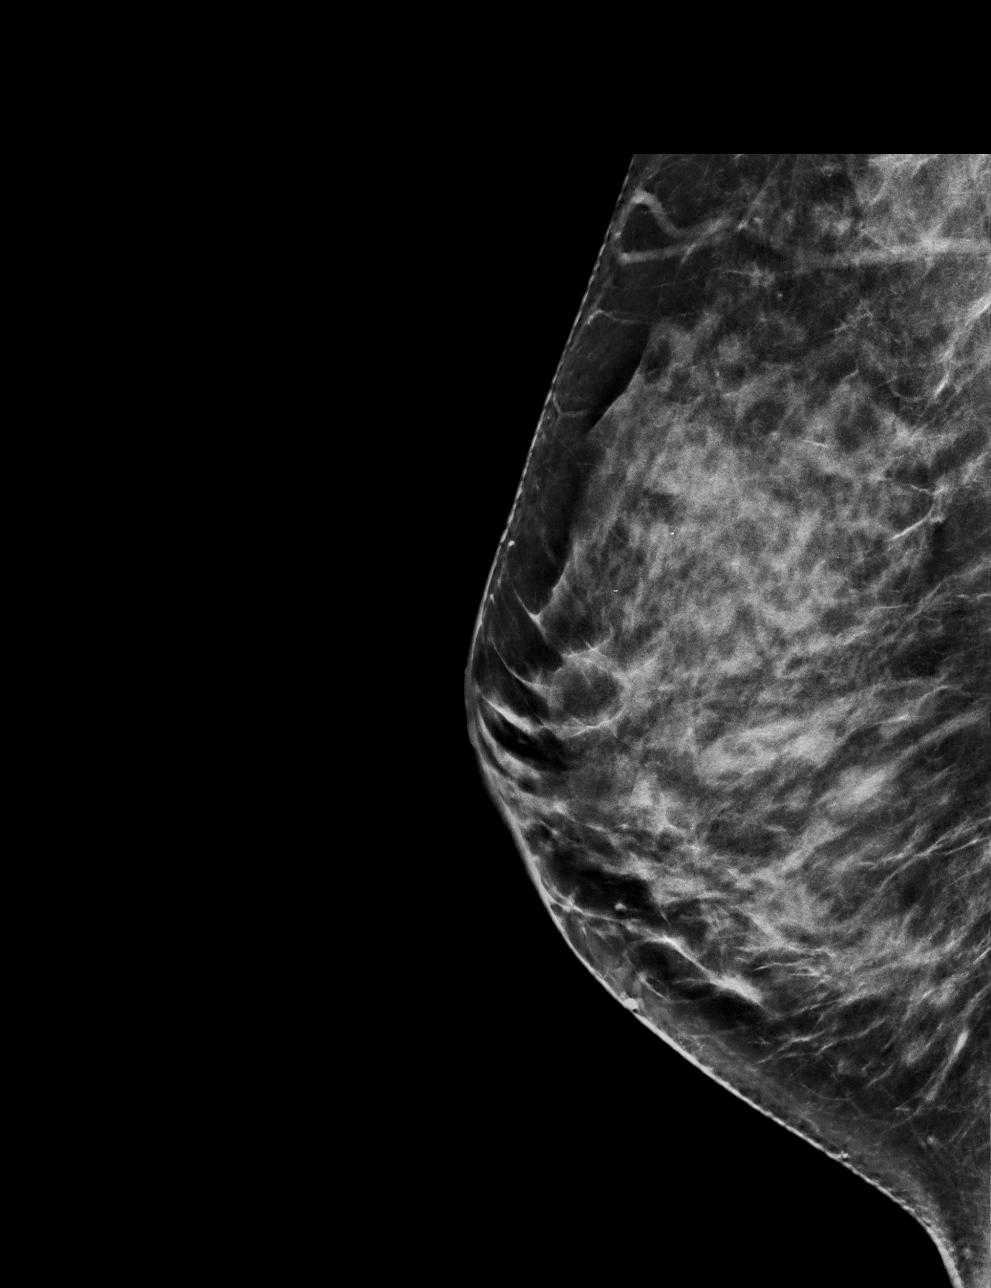

[R MLO synth-2D]
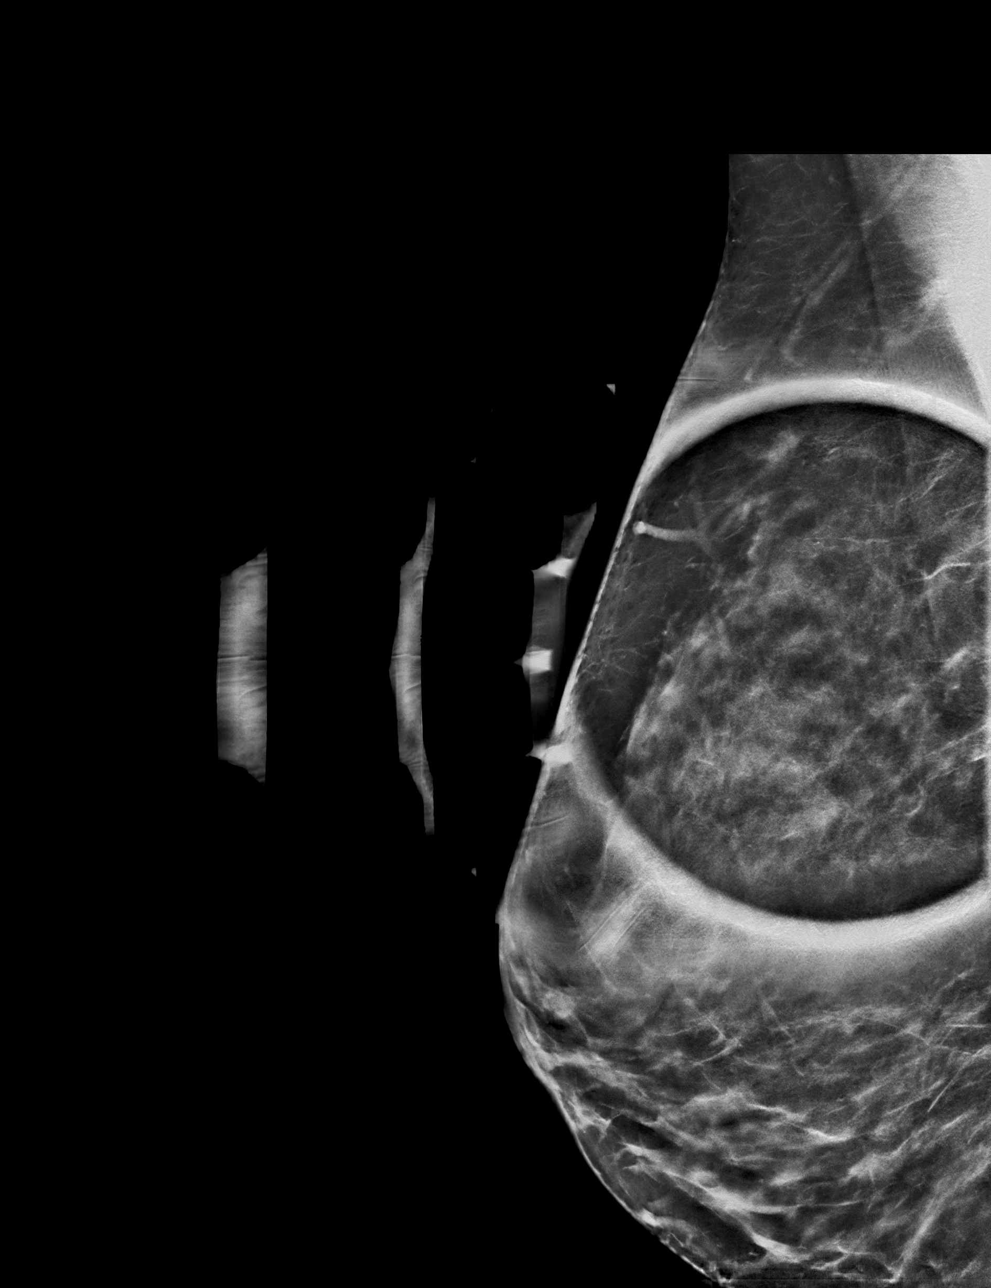

[R MLO tomo · tomo slice 37/74.0]
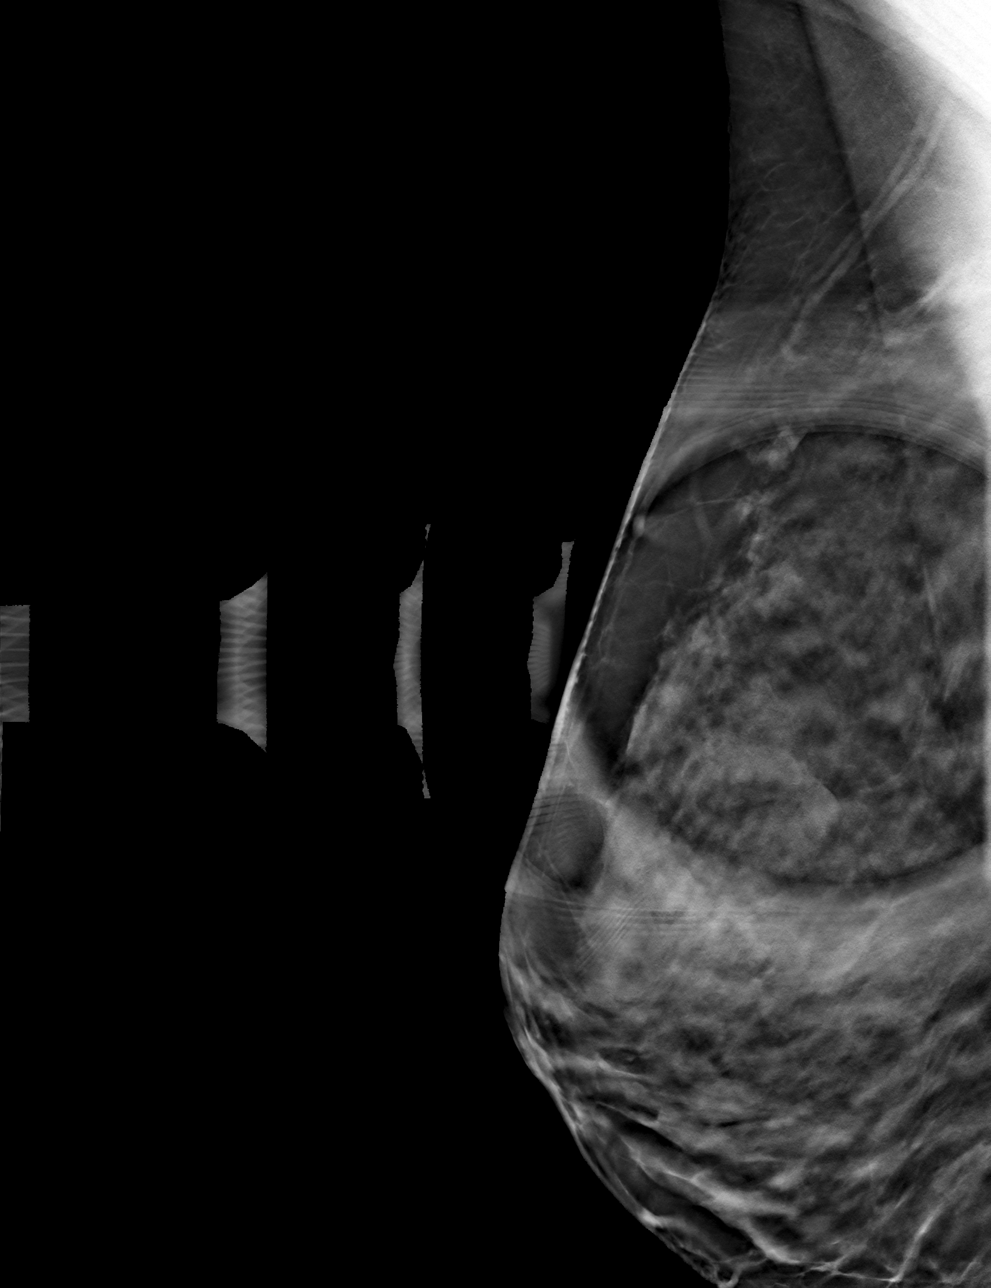

[R ML tomo · tomo slice 35/68.0]
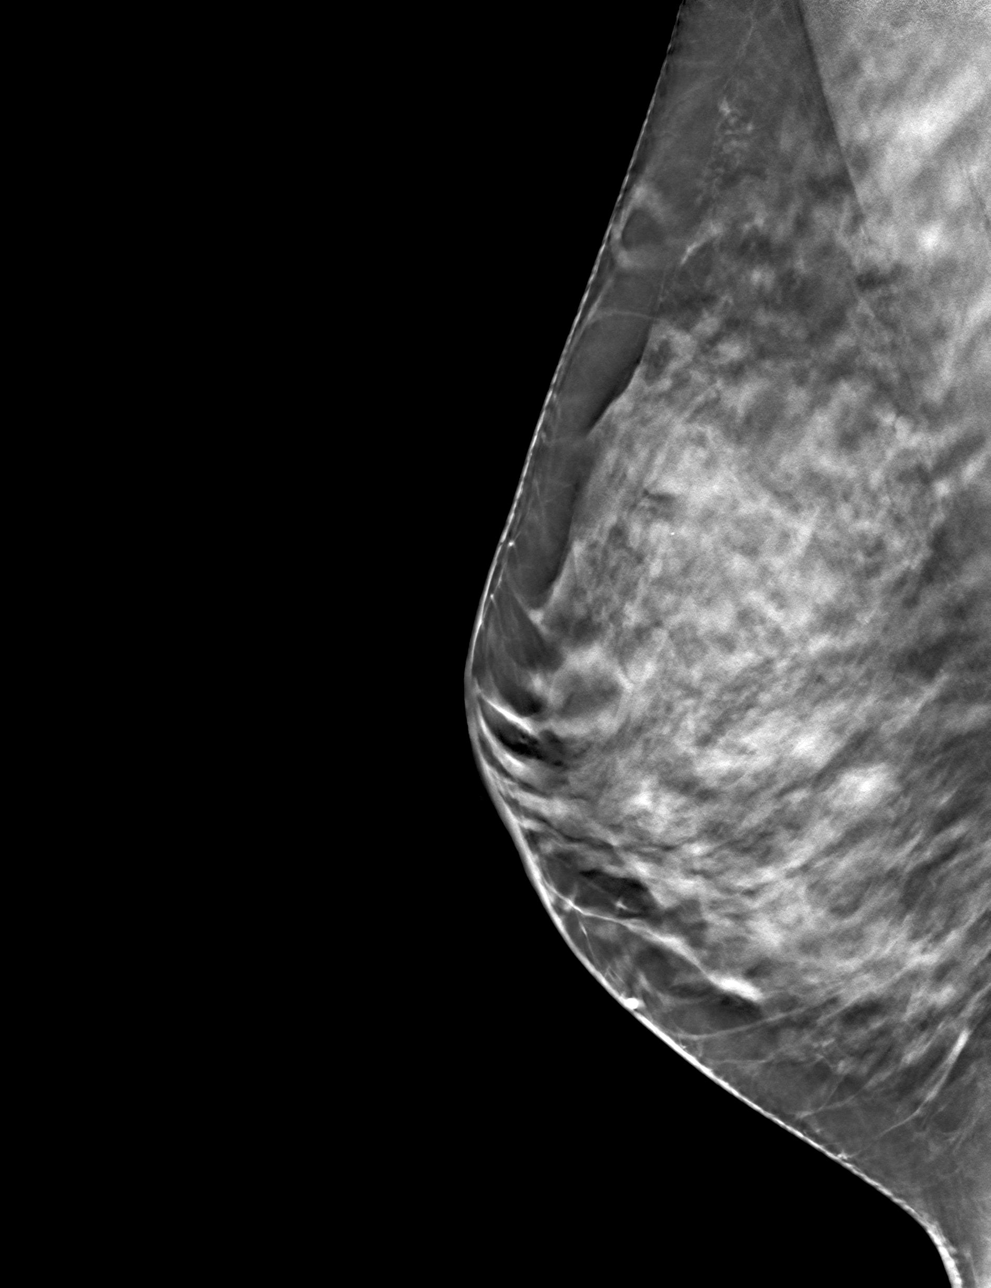

[4 of 12 positions shown; findings below may reference images not displayed]

ACR Breast Density Category c: The breast tissue is heterogeneously
dense, which may obscure small masses.
FINDINGS: Additional 2-D and 3-D images are performed. These views confirm
presence of a partially obscured mass in the UPPER aspect of the
RIGHT breast.

Targeted ultrasound is performed, showing a simple cyst in the 11:30
o'clock location of the RIGHT breast 4 centimeters from the nipple
measuring 1.2 x 0.7 x 1.2 centimeters. No solid mass or areas of
acoustic shadowing.
IMPRESSION: Benign cyst in the RIGHT breast. No mammographic or ultrasound
evidence for malignancy.

RECOMMENDATION:
Screening mammogram in one year.(Code:OT-X-835)

I have discussed the findings and recommendations with the patient
and her husband. If applicable, a reminder letter will be sent to
the patient regarding the next appointment.

BI-RADS CATEGORY  2: Benign.

## 2024-05-12 ENCOUNTER — Other Ambulatory Visit: Payer: Self-pay | Admitting: Pediatrics

## 2024-05-12 DIAGNOSIS — Z1231 Encounter for screening mammogram for malignant neoplasm of breast: Secondary | ICD-10-CM

## 2024-06-08 ENCOUNTER — Encounter: Payer: Self-pay | Admitting: Emergency Medicine

## 2024-06-08 ENCOUNTER — Ambulatory Visit
Admission: EM | Admit: 2024-06-08 | Discharge: 2024-06-08 | Disposition: A | Discharge status: Home / Self Care | Attending: Emergency Medicine | Admitting: Emergency Medicine

## 2024-06-08 DIAGNOSIS — K529 Noninfective gastroenteritis and colitis, unspecified: Secondary | ICD-10-CM

## 2024-06-08 MED ORDER — DICYCLOMINE HCL 20 MG PO TABS: 20.0000 mg | ORAL_TABLET | Freq: Two times a day (BID) | ORAL | 0 refills | Status: AC

## 2024-06-08 MED ORDER — ONDANSETRON 8 MG PO TBDP: 8.0000 mg | ORAL_TABLET | Freq: Three times a day (TID) | ORAL | 0 refills | Status: AC | PRN

## 2024-06-08 NOTE — ED Provider Notes (Signed)
 [facility]    CSN: [id number] Arrival date & time: 06/08/24  1328      History   Chief Complaint Chief Complaint  Patient presents with   Nausea   Emesis   Diarrhea    HPI Krystal Lawrence is a 48 y.o. female.   HPI  48 year old female with past medical history significant for HSV and environmental and seasonal allergies presents for evaluation of GI symptoms that started 6 days ago.  She reports that symptoms began with diarrhea that were followed 2 days later by nausea, vomiting, and diarrhea with green stool.  She reports that she has been running an elevated temp up to 99.  She is also complaining of tenderness in the left lower quadrant.  She denies any recent travel, known sick contacts, eating anything that tasted suspect, or recent antibiotics.  No blood in her stool.  Past Medical History:  Diagnosis Date   Environmental and seasonal allergies    Herpes    Orthodontics    removable braces   Tonsil stone     There are no active problems to display for this patient.   Past Surgical History:  Procedure Laterality Date   APPENDECTOMY     CHOLECYSTECTOMY     SALPINGECTOMY     TONSILLECTOMY N/A 12/05/2018   Procedure: TONSILLECTOMY;  Surgeon: Edda Mt, MD;  Location: [facility];  Service: ENT;  Laterality: N/A;    OB History   No obstetric history on file.      Home Medications    Prior to Admission medications  Medication Sig Start Date End Date Taking? Authorizing Provider  dicyclomine  (BENTYL ) 20 MG tablet Take 1 tablet (20 mg total) by mouth 2 (two) times daily. 06/08/24  Yes Bernardino Ditch, NP  ondansetron  (ZOFRAN -ODT) 8 MG disintegrating tablet Take 1 tablet (8 mg total) by mouth every 8 (eight) hours as needed for nausea or vomiting. 06/08/24  Yes Bernardino Ditch, NP  valACYclovir  (VALTREX ) 1000 MG tablet Take 1 tablet (1,000 mg total) by mouth daily. 11/30/17  Yes Lynda Bradley, CNM  cetirizine (ZYRTEC) 10 MG tablet Take 10  mg by mouth daily.    [provider]  ketorolac  (TORADOL ) 10 MG tablet Take 1 tablet (10 mg total) by mouth every 6 (six) hours as needed for moderate pain or severe pain. 05/28/20   Cook, Jayce G, DO    Family History Family History  Problem Relation Age of Onset   Cancer Mother    Hypertension Mother    Alcoholism Father    Diabetes Father    Breast cancer Neg Hx     Social History Social History[1]   Allergies   Cyclobenzaprine, Tilactase, Lactose, and Lactose intolerance (gi)   Review of Systems Review of Systems  Constitutional:  Positive for fever.  Gastrointestinal:  Positive for abdominal pain, diarrhea, nausea and vomiting. Negative for blood in stool.     Physical Exam Triage Vital Signs ED Triage Vitals  Encounter Vitals Group     BP      Girls Systolic BP Percentile      Girls Diastolic BP Percentile      Boys Systolic BP Percentile      Boys Diastolic BP Percentile      Pulse      Resp      Temp      Temp src      SpO2      Weight      Height  Head Circumference      Peak Flow      Pain Score      Pain Loc      Pain Education      Exclude from Growth Chart    No data found.  Updated Vital Signs BP (!) 167/95 (BP Location: Right Arm)   Pulse 82   Temp 98.1 F (36.7 C) (Oral)   Resp 18   Wt 184 lb (83.5 kg)   SpO2 97%   BMI 31.58 kg/m   Visual Acuity Right Eye Distance:   Left Eye Distance:   Bilateral Distance:    Right Eye Near:   Left Eye Near:    Bilateral Near:     Physical Exam Vitals and nursing note reviewed.  Constitutional:      Appearance: Normal appearance. She is not ill-appearing.  HENT:     Head: Normocephalic and atraumatic.  Cardiovascular:     Rate and Rhythm: Normal rate and regular rhythm.     Pulses: Normal pulses.     Heart sounds: Normal heart sounds. No murmur heard.    No friction rub. No gallop.  Pulmonary:     Effort: Pulmonary effort is normal.     Breath sounds: Normal breath  sounds. No wheezing, rhonchi or rales.  Abdominal:     General: Abdomen is flat.     Palpations: Abdomen is soft.     Tenderness: There is abdominal tenderness. There is no guarding or rebound.  Skin:    General: Skin is warm and dry.     Capillary Refill: Capillary refill takes less than 2 seconds.     Findings: No rash.  Neurological:     General: No focal deficit present.     Mental Status: She is alert and oriented to person, place, and time.      UC Treatments / Results  Labs (all labs ordered are listed, but only abnormal results are displayed) Labs Reviewed - No data to display  EKG   Radiology No results found.  Procedures Procedures (including critical care time)  Medications Ordered in UC Medications - No data to display  Initial Impression / Assessment and Plan / UC Course  I have reviewed the triage vital signs and the nursing notes.  Pertinent labs & imaging results that were available during my care of the patient were reviewed by me and considered in my medical decision making (see chart for details).   Patient is a pleasant, nontoxic-appearing 48 year old female presenting for evaluation of GI symptoms going on for the last 6 days.  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  She also reports that she did not eat anything that tasted funny or was suspect.  She has not been on any antibiotics recently and she denies any international travel.  She is complaining of pain in the left lower quadrant of her abdomen that she just describes as being uncomfortable.  She has been able to drink Gatorade and eat soup but reports that everything she eats goes right through her.  She has not noticed any blood in her stool.  Patient had a colonoscopy performed in June 2024 but the record is not available for viewing in epic.  She reports that it was normal.  No previous history of diverticulitis or diverticulosis.  No family history of ulcerative colitis or  Crohn's.  Given the fact that she had fevers with nausea, vomiting, diarrhea I suspect is most likely  an infectious organism.  She is on city water and not well water.  I will order a GI pathogen panel as well as a C. difficile test to determine the origin of the diarrhea.  Her physical exam reveals a soft abdomen with mild tenderness in the lower portion greater in the left lower quadrant than the right.  No guarding or rebound.  I do not suspect appendicitis.  I will discharge her home on Zofran  to help with nausea and dicyclomine  to help with abdominal cramping.  If she has any worsening abdominal pain, return of fevers, starts passing blood in her stool or that she needs to go to the ER for evaluation.   Final Clinical Impressions(s) / UC Diagnoses   Final diagnoses:  Gastroenteritis     Discharge Instructions      Take the Zofran  every 8 hours as needed for nausea and vomiting.  They are an oral disintegrating tablet and you can place them on her under your tongue and then will be absorbed.  Use the Bentyl  (dicyclomine ) every 6 hours as needed for abdominal cramping.  Follow a clear liquid diet for the next 6 to 12 hours.  Clear liquids consist of broth, ginger ale, water, Pedialyte, and Jell-O.  After 6 to 12 hours, if you are tolerating clear liquids, you can advance to bland foods such as bananas, rice, applesauce, and toast.  If you tolerate bland foods you can continue to advance your diet as you see fit.  Please return your stool specimens when you are able to collect them so that we may process them at the lab.  If you develop a fever over 100.5, increased abdominal pain, bloody vomit, or bloody stool return for reevaluation or go to the ER.      ED Prescriptions     Medication Sig Dispense Auth. Provider   ondansetron  (ZOFRAN -ODT) 8 MG disintegrating tablet Take 1 tablet (8 mg total) by mouth every 8 (eight) hours as needed for nausea or vomiting. 20 tablet Bernardino Ditch,  NP   dicyclomine  (BENTYL ) 20 MG tablet Take 1 tablet (20 mg total) by mouth 2 (two) times daily. 20 tablet Bernardino Ditch, NP      PDMP not reviewed this encounter.    [1]  Social History Tobacco Use   Smoking status: Former    Current packs/day: 0.00    Average packs/day: 2.0 packs/day for 10.0 years (20.0 ttl pk-yrs)    Types: Cigarettes    Start date: 10/29/1997    Quit date: 10/30/2007    Years since quitting: 16.6   Smokeless tobacco: Never  Vaping Use   Vaping status: Never Used  Substance Use Topics   Alcohol use: Yes    Alcohol/week: 4.0 standard drinks of alcohol    Types: 4 Cans of beer per week    Comment: less than 1 per day   Drug use: No     Bernardino Ditch, NP 06/08/24 1444

## 2024-06-08 NOTE — Discharge Instructions (Addendum)
 Take the Zofran  every 8 hours as needed for nausea and vomiting.  They are an oral disintegrating tablet and you can place them on her under your tongue and then will be absorbed.  Use the Bentyl  (dicyclomine ) every 6 hours as needed for abdominal cramping.  Follow a clear liquid diet for the next 6 to 12 hours.  Clear liquids consist of broth, ginger ale, water, Pedialyte, and Jell-O.  After 6 to 12 hours, if you are tolerating clear liquids, you can advance to bland foods such as bananas, rice, applesauce, and toast.  If you tolerate bland foods you can continue to advance your diet as you see fit.  Please return your stool specimens when you are able to collect them so that we may process them at the lab.  If you develop a fever over 100.5, increased abdominal pain, bloody vomit, or bloody stool return for reevaluation or go to the ER.

## 2024-06-08 NOTE — ED Triage Notes (Addendum)
 Sx x 6 days  Emesis- stopped Friday  Diarrhea- still having  Fever - stopped Friday   Didn't eat anything out of the ordinary

## 2024-06-09 ENCOUNTER — Other Ambulatory Visit
Admission: RE | Admit: 2024-06-09 | Discharge: 2024-06-09 | Disposition: A | Discharge status: Home / Self Care | Source: Ambulatory Visit | Attending: Emergency Medicine | Admitting: Emergency Medicine

## 2024-06-09 DIAGNOSIS — K529 Noninfective gastroenteritis and colitis, unspecified: Secondary | ICD-10-CM | POA: Diagnosis present

## 2024-06-09 LAB — C DIFFICILE QUICK SCREEN W PCR REFLEX
C Diff antigen: NEGATIVE
C Diff interpretation: NEGATIVE
C Diff toxin: NEGATIVE

## 2024-06-10 LAB — GASTROINTESTINAL PANEL BY PCR, STOOL (REPLACES STOOL CULTURE)

## 2024-06-13 ENCOUNTER — Ambulatory Visit (HOSPITAL_COMMUNITY): Payer: Self-pay

## 2024-06-24 ENCOUNTER — Ambulatory Visit
Admission: RE | Admit: 2024-06-24 | Discharge: 2024-06-24 | Payer: Self-pay | Attending: Pediatrics | Admitting: Pediatrics

## 2024-06-24 DIAGNOSIS — Z1231 Encounter for screening mammogram for malignant neoplasm of breast: Secondary | ICD-10-CM | POA: Insufficient documentation
